# Patient Record
Sex: Male | Born: 1935 | Race: White | Hispanic: No | Marital: Married | Smoking: Never smoker
Health system: Southern US, Community
[De-identification: ages and names within clinical notes are randomized; demographics above are authoritative.]

## PROBLEM LIST (undated history)

## (undated) DIAGNOSIS — I779 Disorder of arteries and arterioles, unspecified: Secondary | ICD-10-CM

## (undated) DIAGNOSIS — N184 Chronic kidney disease, stage 4 (severe): Secondary | ICD-10-CM

## (undated) DIAGNOSIS — I739 Peripheral vascular disease, unspecified: Secondary | ICD-10-CM

## (undated) DIAGNOSIS — Z8719 Personal history of other diseases of the digestive system: Secondary | ICD-10-CM

## (undated) DIAGNOSIS — E669 Obesity, unspecified: Secondary | ICD-10-CM

## (undated) DIAGNOSIS — K219 Gastro-esophageal reflux disease without esophagitis: Secondary | ICD-10-CM

## (undated) DIAGNOSIS — E785 Hyperlipidemia, unspecified: Secondary | ICD-10-CM

## (undated) DIAGNOSIS — I480 Paroxysmal atrial fibrillation: Secondary | ICD-10-CM

## (undated) DIAGNOSIS — I255 Ischemic cardiomyopathy: Secondary | ICD-10-CM

## (undated) DIAGNOSIS — Z8701 Personal history of pneumonia (recurrent): Secondary | ICD-10-CM

## (undated) DIAGNOSIS — E119 Type 2 diabetes mellitus without complications: Secondary | ICD-10-CM

## (undated) DIAGNOSIS — I2510 Atherosclerotic heart disease of native coronary artery without angina pectoris: Secondary | ICD-10-CM

## (undated) DIAGNOSIS — I10 Essential (primary) hypertension: Secondary | ICD-10-CM

## (undated) DIAGNOSIS — M1990 Unspecified osteoarthritis, unspecified site: Secondary | ICD-10-CM

## (undated) DIAGNOSIS — D509 Iron deficiency anemia, unspecified: Secondary | ICD-10-CM

## (undated) HISTORY — DX: Paroxysmal atrial fibrillation: I48.0

## (undated) HISTORY — DX: Personal history of other diseases of the digestive system: Z87.19

## (undated) HISTORY — DX: Type 2 diabetes mellitus without complications: E11.9

## (undated) HISTORY — DX: Peripheral vascular disease, unspecified: I73.9

## (undated) HISTORY — DX: Ischemic cardiomyopathy: I25.5

## (undated) HISTORY — PX: ANTERIOR CERVICAL DISCECTOMY: SHX1160

## (undated) HISTORY — DX: Essential (primary) hypertension: I10

## (undated) HISTORY — DX: Disorder of arteries and arterioles, unspecified: I77.9

## (undated) HISTORY — DX: Atherosclerotic heart disease of native coronary artery without angina pectoris: I25.10

## (undated) HISTORY — DX: Personal history of pneumonia (recurrent): Z87.01

## (undated) HISTORY — DX: Chronic kidney disease, stage 4 (severe): N18.4

## (undated) HISTORY — PX: LAMINECTOMY: SHX219

## (undated) HISTORY — DX: Obesity, unspecified: E66.9

## (undated) HISTORY — DX: Unspecified osteoarthritis, unspecified site: M19.90

## (undated) HISTORY — DX: Iron deficiency anemia, unspecified: D50.9

---

## 1997-04-24 ENCOUNTER — Inpatient Hospital Stay (HOSPITAL_COMMUNITY): Admission: EM | Admit: 1997-04-24 | Discharge: 1997-04-27 | Payer: Self-pay | Admitting: Cardiology

## 1997-04-29 ENCOUNTER — Inpatient Hospital Stay (HOSPITAL_COMMUNITY): Admission: EM | Admit: 1997-04-29 | Discharge: 1997-05-02 | Payer: Self-pay | Admitting: Emergency Medicine

## 1997-12-12 ENCOUNTER — Encounter: Payer: Self-pay | Admitting: Neurological Surgery

## 1997-12-12 ENCOUNTER — Inpatient Hospital Stay (HOSPITAL_COMMUNITY): Admission: RE | Admit: 1997-12-12 | Discharge: 1997-12-16 | Payer: Self-pay | Admitting: Neurological Surgery

## 2000-04-21 ENCOUNTER — Emergency Department (HOSPITAL_COMMUNITY): Admission: EM | Admit: 2000-04-21 | Discharge: 2000-04-22 | Payer: Self-pay | Admitting: Emergency Medicine

## 2000-04-25 ENCOUNTER — Encounter: Payer: Self-pay | Admitting: Neurological Surgery

## 2000-04-26 ENCOUNTER — Encounter: Payer: Self-pay | Admitting: Neurological Surgery

## 2000-04-26 ENCOUNTER — Inpatient Hospital Stay (HOSPITAL_COMMUNITY): Admission: RE | Admit: 2000-04-26 | Discharge: 2000-04-27 | Payer: Self-pay | Admitting: Neurological Surgery

## 2001-10-10 ENCOUNTER — Encounter: Payer: Self-pay | Admitting: Neurological Surgery

## 2001-10-12 ENCOUNTER — Inpatient Hospital Stay (HOSPITAL_COMMUNITY): Admission: RE | Admit: 2001-10-12 | Discharge: 2001-10-13 | Payer: Self-pay | Admitting: Neurological Surgery

## 2001-10-12 ENCOUNTER — Encounter: Payer: Self-pay | Admitting: Neurological Surgery

## 2002-01-11 HISTORY — PX: CORONARY ARTERY BYPASS GRAFT: SHX141

## 2002-05-30 ENCOUNTER — Encounter: Payer: Self-pay | Admitting: Neurological Surgery

## 2002-05-30 ENCOUNTER — Ambulatory Visit (HOSPITAL_COMMUNITY): Admission: RE | Admit: 2002-05-30 | Discharge: 2002-05-30 | Payer: Self-pay | Admitting: Neurological Surgery

## 2002-09-20 ENCOUNTER — Inpatient Hospital Stay (HOSPITAL_COMMUNITY): Admission: EM | Admit: 2002-09-20 | Discharge: 2002-09-30 | Payer: Self-pay | Admitting: Cardiology

## 2002-09-22 ENCOUNTER — Encounter: Payer: Self-pay | Admitting: Cardiothoracic Surgery

## 2002-09-24 ENCOUNTER — Encounter: Payer: Self-pay | Admitting: Thoracic Surgery (Cardiothoracic Vascular Surgery)

## 2002-09-25 ENCOUNTER — Encounter: Payer: Self-pay | Admitting: Thoracic Surgery (Cardiothoracic Vascular Surgery)

## 2002-09-26 ENCOUNTER — Encounter: Payer: Self-pay | Admitting: Thoracic Surgery (Cardiothoracic Vascular Surgery)

## 2002-10-25 ENCOUNTER — Encounter
Admission: RE | Admit: 2002-10-25 | Discharge: 2002-10-25 | Payer: Self-pay | Admitting: Thoracic Surgery (Cardiothoracic Vascular Surgery)

## 2002-10-25 ENCOUNTER — Encounter: Payer: Self-pay | Admitting: Thoracic Surgery (Cardiothoracic Vascular Surgery)

## 2004-01-10 ENCOUNTER — Ambulatory Visit: Payer: Self-pay | Admitting: Cardiology

## 2004-05-06 ENCOUNTER — Inpatient Hospital Stay (HOSPITAL_COMMUNITY): Admission: RE | Admit: 2004-05-06 | Discharge: 2004-05-15 | Payer: Self-pay | Admitting: Neurological Surgery

## 2004-12-11 ENCOUNTER — Ambulatory Visit: Payer: Self-pay | Admitting: Internal Medicine

## 2004-12-12 ENCOUNTER — Inpatient Hospital Stay (HOSPITAL_COMMUNITY): Admission: EM | Admit: 2004-12-12 | Discharge: 2004-12-17 | Payer: Self-pay | Admitting: Emergency Medicine

## 2004-12-15 ENCOUNTER — Ambulatory Visit: Payer: Self-pay | Admitting: Cardiovascular Disease

## 2004-12-15 ENCOUNTER — Encounter: Payer: Self-pay | Admitting: Cardiovascular Disease

## 2004-12-15 ENCOUNTER — Ambulatory Visit: Payer: Self-pay | Admitting: Internal Medicine

## 2004-12-16 ENCOUNTER — Encounter (INDEPENDENT_AMBULATORY_CARE_PROVIDER_SITE_OTHER): Payer: Self-pay | Admitting: *Deleted

## 2004-12-22 ENCOUNTER — Ambulatory Visit: Payer: Self-pay | Admitting: Cardiology

## 2005-01-05 ENCOUNTER — Ambulatory Visit: Payer: Self-pay | Admitting: Internal Medicine

## 2005-04-19 ENCOUNTER — Ambulatory Visit: Payer: Self-pay | Admitting: Cardiology

## 2005-05-10 ENCOUNTER — Ambulatory Visit: Payer: Self-pay | Admitting: Internal Medicine

## 2005-05-26 ENCOUNTER — Ambulatory Visit: Payer: Self-pay | Admitting: Internal Medicine

## 2005-12-22 ENCOUNTER — Encounter: Payer: Self-pay | Admitting: Cardiology

## 2007-05-26 ENCOUNTER — Encounter: Payer: Self-pay | Admitting: Cardiology

## 2007-08-10 ENCOUNTER — Encounter: Payer: Self-pay | Admitting: Cardiology

## 2007-11-30 ENCOUNTER — Encounter: Payer: Self-pay | Admitting: Cardiology

## 2007-12-01 ENCOUNTER — Ambulatory Visit: Payer: Self-pay | Admitting: Cardiology

## 2007-12-05 ENCOUNTER — Ambulatory Visit: Payer: Self-pay | Admitting: Cardiology

## 2007-12-05 DIAGNOSIS — E119 Type 2 diabetes mellitus without complications: Secondary | ICD-10-CM

## 2007-12-06 ENCOUNTER — Encounter: Payer: Self-pay | Admitting: Cardiology

## 2008-01-25 ENCOUNTER — Encounter: Payer: Self-pay | Admitting: Cardiology

## 2008-02-29 ENCOUNTER — Encounter: Payer: Self-pay | Admitting: Cardiology

## 2008-06-14 ENCOUNTER — Encounter: Payer: Self-pay | Admitting: Cardiology

## 2008-11-05 ENCOUNTER — Telehealth (INDEPENDENT_AMBULATORY_CARE_PROVIDER_SITE_OTHER): Payer: Self-pay | Admitting: *Deleted

## 2008-11-14 ENCOUNTER — Encounter: Payer: Self-pay | Admitting: Cardiology

## 2008-12-23 ENCOUNTER — Telehealth (INDEPENDENT_AMBULATORY_CARE_PROVIDER_SITE_OTHER): Payer: Self-pay | Admitting: *Deleted

## 2009-03-28 ENCOUNTER — Telehealth (INDEPENDENT_AMBULATORY_CARE_PROVIDER_SITE_OTHER): Payer: Self-pay | Admitting: *Deleted

## 2009-04-04 ENCOUNTER — Encounter: Payer: Self-pay | Admitting: Cardiology

## 2009-04-07 ENCOUNTER — Telehealth: Payer: Self-pay | Admitting: Cardiology

## 2009-05-12 ENCOUNTER — Ambulatory Visit: Payer: Self-pay | Admitting: Cardiology

## 2009-05-12 ENCOUNTER — Encounter (INDEPENDENT_AMBULATORY_CARE_PROVIDER_SITE_OTHER): Payer: Self-pay | Admitting: *Deleted

## 2009-05-12 DIAGNOSIS — R0602 Shortness of breath: Secondary | ICD-10-CM | POA: Insufficient documentation

## 2009-05-13 ENCOUNTER — Encounter: Payer: Self-pay | Admitting: Cardiology

## 2009-05-15 ENCOUNTER — Encounter: Payer: Self-pay | Admitting: Cardiology

## 2009-05-16 ENCOUNTER — Ambulatory Visit: Payer: Self-pay | Admitting: Cardiology

## 2009-05-19 ENCOUNTER — Encounter: Payer: Self-pay | Admitting: Cardiology

## 2009-05-21 ENCOUNTER — Encounter (INDEPENDENT_AMBULATORY_CARE_PROVIDER_SITE_OTHER): Payer: Self-pay | Admitting: *Deleted

## 2009-05-22 ENCOUNTER — Inpatient Hospital Stay (HOSPITAL_COMMUNITY): Admission: AD | Admit: 2009-05-22 | Discharge: 2009-05-24 | Payer: Self-pay | Admitting: Cardiology

## 2009-05-22 ENCOUNTER — Encounter: Payer: Self-pay | Admitting: Physician Assistant

## 2009-05-22 ENCOUNTER — Ambulatory Visit: Payer: Self-pay | Admitting: Cardiology

## 2009-05-24 ENCOUNTER — Encounter: Payer: Self-pay | Admitting: Cardiology

## 2009-05-26 ENCOUNTER — Encounter: Payer: Self-pay | Admitting: Physician Assistant

## 2009-05-27 ENCOUNTER — Encounter: Payer: Self-pay | Admitting: Cardiology

## 2009-05-29 ENCOUNTER — Telehealth (INDEPENDENT_AMBULATORY_CARE_PROVIDER_SITE_OTHER): Payer: Self-pay | Admitting: *Deleted

## 2009-05-29 ENCOUNTER — Encounter: Payer: Self-pay | Admitting: Cardiology

## 2009-05-31 ENCOUNTER — Encounter: Payer: Self-pay | Admitting: Cardiology

## 2009-06-05 ENCOUNTER — Ambulatory Visit: Payer: Self-pay | Admitting: Cardiology

## 2009-06-10 ENCOUNTER — Encounter: Payer: Self-pay | Admitting: Cardiology

## 2009-06-11 ENCOUNTER — Ambulatory Visit: Payer: Self-pay | Admitting: Cardiology

## 2009-06-12 ENCOUNTER — Encounter: Payer: Self-pay | Admitting: Cardiology

## 2009-06-14 ENCOUNTER — Encounter: Payer: Self-pay | Admitting: Cardiology

## 2009-06-19 ENCOUNTER — Encounter (INDEPENDENT_AMBULATORY_CARE_PROVIDER_SITE_OTHER): Payer: Self-pay | Admitting: *Deleted

## 2009-06-23 ENCOUNTER — Encounter: Payer: Self-pay | Admitting: Cardiology

## 2009-06-26 ENCOUNTER — Telehealth (INDEPENDENT_AMBULATORY_CARE_PROVIDER_SITE_OTHER): Payer: Self-pay | Admitting: *Deleted

## 2009-07-18 ENCOUNTER — Encounter: Payer: Self-pay | Admitting: Physician Assistant

## 2009-07-18 ENCOUNTER — Encounter: Payer: Self-pay | Admitting: Cardiology

## 2009-07-23 ENCOUNTER — Encounter: Payer: Self-pay | Admitting: Cardiology

## 2009-08-08 ENCOUNTER — Encounter: Payer: Self-pay | Admitting: Cardiology

## 2009-08-19 ENCOUNTER — Encounter: Payer: Self-pay | Admitting: Cardiology

## 2009-08-25 ENCOUNTER — Telehealth (INDEPENDENT_AMBULATORY_CARE_PROVIDER_SITE_OTHER): Payer: Self-pay | Admitting: *Deleted

## 2009-08-29 ENCOUNTER — Encounter: Payer: Self-pay | Admitting: Cardiology

## 2009-09-02 ENCOUNTER — Ambulatory Visit: Payer: Self-pay | Admitting: Cardiology

## 2009-09-10 ENCOUNTER — Encounter (INDEPENDENT_AMBULATORY_CARE_PROVIDER_SITE_OTHER): Payer: Self-pay | Admitting: *Deleted

## 2009-09-17 ENCOUNTER — Encounter: Payer: Self-pay | Admitting: Cardiology

## 2009-09-18 ENCOUNTER — Encounter: Payer: Self-pay | Admitting: Cardiology

## 2009-09-25 ENCOUNTER — Telehealth: Payer: Self-pay | Admitting: Cardiology

## 2009-10-13 ENCOUNTER — Telehealth (INDEPENDENT_AMBULATORY_CARE_PROVIDER_SITE_OTHER): Payer: Self-pay | Admitting: *Deleted

## 2009-12-29 ENCOUNTER — Encounter: Payer: Self-pay | Admitting: Cardiology

## 2010-02-10 NOTE — Miscellaneous (Signed)
Summary: Rehab Report/ CARDIAC REHAB DISCHARGE SUMMARY  Rehab Report/ CARDIAC REHAB DISCHARGE SUMMARY   Imported By: Dorise Hiss 09/23/2009 09:56:05  _____________________________________________________________________  External Attachment:    Type:   Image     Comment:   External Document

## 2010-02-10 NOTE — Assessment & Plan Note (Signed)
Summary: 3 MONTH FU -RECV REMINDER VS   Visit Type:  Follow-up Referring Berton Butrick:  Dr. Harland Dingwall Primary Hiep Ollis:  Dr. Margo Common   History of Present Illness: the patient is a 77 year old male history of coronary artery disease status post coronary bypass grafting. The patient underwent a recent heart catheterization and was found to have significant graft disease as well as native coronary artery disease. However it was felt the patient should be treated medically. He actually has done well. He denies any recurrent substernal chest pain. He denies any shortness of breath he does have dyspnea on exertion which is stable and is NYHA class IIb/3. A recent echocardiogram demonstrated an ejection fraction of 40-45% with segmental wall motion abnormalities. There was no significant valvular lesions. The patient also has chronic renal insufficiency and his creatinine has improved and is down to 1.56. He also has significant anemia with hemoglobin 9.8 but after supplementation with iron therapy 3 times a day his hemoglobin is now improved to 11.2. we do not have his black stool samples available the patient is a prior history of GI bleeding. The patient did not have a colonoscopy since 2006. His diabetes under better control and metformin has been able to be discontinued.  Preventive Screening-Counseling & Management  Alcohol-Tobacco     Smoking Status: never  Current Medications (verified): 1)  Glucophage 500 Mg Tabs (Metformin Hcl) .... Take 1 Tablet By Mouth Two Times A Day(On Hold) 2)  Norvasc 5 Mg Tabs (Amlodipine Besylate) .... Take 1 Tablet By Mouth Once A Day 3)  Glucotrol Xl 10 Mg Xr24h-Tab (Glipizide) .... Take 1 Tablet By Mouth Once A Day 4)  Aspirin 81 Mg Tbec (Aspirin) .... Take One Tablet By Mouth Daily 5)  Protonix 40 Mg Tbec (Pantoprazole Sodium) .... Take 1 Tablet By Mouth Once A Day 6)  Plavix 75 Mg Tabs (Clopidogrel Bisulfate) .... Take 1 Tablet By Mouth Once A Day 7)   Nitrostat 0.4 Mg Subl (Nitroglycerin) .... Place 1 Tablet Under Tongue As Directed 8)  Cardura 4 Mg Tabs (Doxazosin Mesylate) .... Take 1 Tablet By Mouth Two Times A Day 9)  Metanx 3-35-2 Mg Tabs (L-Methylfolate-B6-B12) .... Take 1 Tablet By Mouth Once A Day 10)  Pravastatin Sodium 80 Mg Tabs (Pravastatin Sodium) .... Take 1 Tab By Mouth At Bedtime 11)  Hydrochlorothiazide 25 Mg Tabs (Hydrochlorothiazide) .... Take One Tablet By Mouth Daily. 12)  Imdur 30 Mg Xr24h-Tab (Isosorbide Mononitrate) .... Take 1 Tablet By Mouth Once A Day 13)  Coreg 3.125 Mg Tabs (Carvedilol) .... Take 1 Tablet By Mouth Two Times A Day 14)  Ferrous Sulfate 325 (65 Fe) Mg Tbec (Ferrous Sulfate) .... Take 1 Tablet By Mouth Three Times A Day  Allergies: 1)  Penicillin  Comments:  Nurse/Medical Assistant: The patient's medication list and allergies were reviewed with the patient and were updated in the Medication and Allergy Lists.  Past History:  Family History: Last updated: 12/05/2007 notable for father dying of heart disease and mother is still living at age 63 has coronary artery disease. he has also a brother with coronary artery disease.  Social History: Last updated: 12/05/2007 the patient is married denies tobacco or alcohol use.  Risk Factors: Smoking Status: never (09/02/2009)  Past Medical History: 1. Continued patency of the internal mammary to the LAD. 2. 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 3. Severe multiple high-grade lesions with large clot burden in the     saphenous vein graft to the  OM with small distal target, and high-     grade in-stent restenosis. 4. Severe native coronary artery disease.  Status post coronary bypass grafting 2004 with a l to the LAD and the sac was vein graft obtuse marginal and a second vein graft to the diagonal.   Preserved LV function with an ejection fraction of 55% history of upper GI bleeding status post EGD and colonoscopy.    anticoagulation with aspirin and Plavix  iron deficiency anemia secondary to GI bleed improved with current therapy.  diabetes mellitus Chronic renal insufficiency rather than baseline 1.56 hypertension obesity degenerative joint disease history of postoperative atrial fibrillation.  Review of Systems       The patient complains of shortness of breath and muscle weakness.  The patient denies fatigue, malaise, fever, weight gain/loss, vision loss, decreased hearing, hoarseness, chest pain, palpitations, prolonged cough, wheezing, sleep apnea, coughing up blood, abdominal pain, blood in stool, nausea, vomiting, diarrhea, heartburn, incontinence, blood in urine, joint pain, leg swelling, rash, skin lesions, headache, fainting, dizziness, depression, anxiety, enlarged lymph nodes, easy bruising or bleeding, and environmental allergies.    Vital Signs:  Patient profile:   77 year old male Height:      73 inches Weight:      266 pounds O2 Sat:      97 % on Room air Pulse rate:   47 / minute BP sitting:   160 / 75  (left arm) Cuff size:   large  Vitals Entered By: Carlye Grippe (September 02, 2009 2:27 PM)  O2 Flow:  Room air  Serial Vital Signs/Assessments:  Time      Position  BP       Pulse  Resp  Temp     By 2:33 PM             144/73   46                    Lydia Anderson                                PEF    PreRx  PostRx Time      O2 Sat  O2 Type     L/min  L/min  L/min   By 2:33 PM   96  %   Room air                          Carlye Grippe   Physical Exam  Additional Exam:  General: Well-developed, well-nourished in no distress head: Normocephalic and atraumatic eyes PERRLA/EOMI intact, conjunctiva and lids normal nose: No deformity or lesions mouth normal dentition, normal posterior pharynx neck: Supple, no JVD.  No masses, thyromegaly or abnormal cervical nodes lungs: Normal breath sounds bilaterally without wheezing.  Normal percussion heart: regular rate and rhythm  with normal S1 and S2, no S3 or S4.  PMI is normal.  No pathological murmurs abdomen: Normal bowel sounds, abdomen is soft and nontender without masses, organomegaly or hernias noted.  No hepatosplenomegaly musculoskeletal: Back normal, normal gait muscle strength and tone normal pulsus: Pulse is normal in all 4 extremities Extremities: No peripheral pitting edema neurologic: Alert and oriented x 3 skin: Intact without lesions or rashes cervical nodes: No significant adenopathy psychologic: Normal affect    Impression & Recommendations:  Problem # 1:  RENAL FAILURE, CHRONIC (ICD-585.9) creatinine improved significantly.  Problem # 2:  ANEMIA (ICD-285.9) patient has iron deficiency anemia. Hemoglobin has increased significantly with iron supplementation. We'll obtain guaiac stool samples and deferred patient whose primary care physician for further evaluation. His last colonoscopy for GI bleeding was in 2006.  Problem # 3:  LEFT VENTRICULAR FUNCTION, DECREASED (ICD-429.2) ejection fraction is 40-45%. The patient is NYHA class IIb. He has no resting symptoms. He does not appear to be volume overloaded. Will not change his medical regimen for now  Problem # 4:  CORONARY ARTERY BYPASS GRAFT, HX OF (ICD-V45.81) patient had a recent cardiac catheter. He has significant disease. The plan is to continue medical therapy for now. The patient did not have good targets for either PCI or redo surgery.  Patient Instructions: 1)  Iron 325mg  three times a day 2)  Follow up in  6 months Prescriptions: FERROUS SULFATE 325 (65 FE) MG TBEC (FERROUS SULFATE) Take 1 tablet by mouth three times a day  #90 x 3   Entered by:   Hoover Brunette, LPN   Authorized by:   Lewayne Bunting, MD, [facility]   Signed by:   Hoover Brunette, LPN on 45/40/9811   Method used:   Electronically to        [facility] (retail)       [address]       [place], [place]  [address]       Ph: [phone]       Fax: [phone]    RxID:   [id number]

## 2010-02-10 NOTE — Assessment & Plan Note (Signed)
Summary: f21m   Visit Type:  Follow-up Referring Provider:  Dr. Harland Dingwall Primary Provider:  Dr. Margo Common   History of Present Illness: the patient is a 77 year old male with coronary artery disease.e, status post coronary bypass grafting.coronary bypass grafting was in 2004. The patient had it would be catheterization 2006 and was found to have a 90% stenosis at the distal insertion site of the vein graft to the circumflex coronary artery. The patient underwent drug-eluting stent placement. He also has stress testing 2009 with an area of inferior ischemia and an ejection fraction of 43% but the patient was treated medically. He has done well with no recurrent chest pain. Unfortunately over the last several months he has noticed some decrease in energy and increasing weight. He also has decreased stamina. In the past he has been unable to tolerate Lipitor due to myalgias.  The patient denies intercurrent chest pain as reported above has increased short of breath on exertion. He denies any palpitations or syncope.  Preventive Screening-Counseling & Management  Alcohol-Tobacco     Smoking Status: never  Current Problems (verified): 1)  Carotid Bruit, Right  (ICD-785.9) 2)  Shortness of Breath  (ICD-786.05) 3)  Oth Spec D/o Result From Impaired Renal Function  (ICD-588.89) 4)  Gastrointestinal Hemorrhage, Hx of  (ICD-V12.79) 5)  Aodm  (ICD-250.00) 6)  Essential Hypertension, Benign  (ICD-401.1) 7)  Oth Nonspecific Abnorm Cv System Function Study  (ICD-794.39) 8)  Coronary Artery Disease, S/p Ptca  (ICD-414.9) 9)  Myocardial Infarction, Hx of  (ICD-412) 10)  Coronary Artery Bypass Graft, Hx of  (ICD-V45.81) 11)  Coronary Atherosclerosis Native Coronary Artery  (ICD-414.01)  Current Medications (verified): 1)  Glucophage 500 Mg Tabs (Metformin Hcl) .... Take 1 Tablet By Mouth Two-Three Times A Day 2)  Norvasc 5 Mg Tabs (Amlodipine Besylate) .... Take 1 Tablet By Mouth Once A Day 3)   Glucotrol Xl 10 Mg Xr24h-Tab (Glipizide) .... Take 1 Tablet By Mouth Two Times A Day 4)  Aspirin 81 Mg Tbec (Aspirin) .... Take One Tablet By Mouth Daily 5)  Protonix 40 Mg Tbec (Pantoprazole Sodium) .... Take 1 Tablet By Mouth Once A Day 6)  Plavix 75 Mg Tabs (Clopidogrel Bisulfate) .... Take 1 Tablet By Mouth Once A Day 7)  Benazepril Hcl 10 Mg Tabs (Benazepril Hcl) .... Take 1 Tablet By Mouth Once A Day 8)  Nitrostat 0.4 Mg Subl (Nitroglycerin) .... Place 1 Tablet Under Tongue As Directed 9)  Cardura 4 Mg Tabs (Doxazosin Mesylate) .... Take 1 Tablet By Mouth Two Times A Day 10)  Metanx 3-35-2 Mg Tabs (L-Methylfolate-B6-B12) .... Take 1 Tablet By Mouth Once A Day 11)  Pravastatin Sodium 20 Mg Tabs (Pravastatin Sodium) .... Take 1 Tab By Mouth At Bedtime  Allergies: 1)  Penicillin 2)  Penicillin V Potassium (Penicillin V Potassium)  Comments:  Nurse/Medical Assistant: The patient's medications were reviewed with the patient and were updated in the Medication List. Pt brought a list of medications to office visit.  Cyril Loosen, RN, BSN (May 12, 2009 3:27 PM)  Past History:  Past Medical History: Last updated: 12/05/2007 modest elevation microinfarction per Dr. stent of the distal circumflex 2006 residual coronary artery disease 90% proximal right coronary artery with distal occlusion medical therapy recommended. Status post coronary bypass grafting 2004 with a l to the LAD and the sac was vein graft obtuse marginal and a second vein graft to the diagonal.  All grafts patent on catheterization. Preserved LV function with an  ejection fraction of 55% history of upper GI bleeding status post EGD and colonoscopy.   anticoagulation with aspirin and Plavix  iron deficiency anemia secondary to GI bleed  diabetes mellitus hypertension obesity degenerative joint disease history of postoperative atrial fibrillation.  Family History: Last updated: 12/05/2007 notable for father dying of  heart disease and mother is still living at age 28 has coronary artery disease. he has also a brother with coronary artery disease.  Social History: Last updated: 12/05/2007 the patient is married denies tobacco or alcohol use.  Social History: Smoking Status:  never  Review of Systems       The patient complains of fatigue.  The patient denies malaise, fever, weight gain/loss, vision loss, decreased hearing, hoarseness, chest pain, palpitations, shortness of breath, prolonged cough, wheezing, sleep apnea, coughing up blood, abdominal pain, blood in stool, nausea, vomiting, diarrhea, heartburn, incontinence, blood in urine, muscle weakness, joint pain, leg swelling, rash, skin lesions, headache, fainting, dizziness, depression, anxiety, enlarged lymph nodes, easy bruising or bleeding, and environmental allergies.    Vital Signs:  Patient profile:   77 year old male Height:      73 inches Weight:      276.50 pounds BMI:     36.61 Pulse rate:   57 / minute BP sitting:   164 / 72  (left arm) Cuff size:   large  Vitals Entered By: Cyril Loosen, RN, BSN (May 12, 2009 3:21 PM)  Nutrition Counseling: Patient's BMI is greater than 25 and therefore counseled on weight management options. Comments Follow up visit   Physical Exam  Additional Exam:  General: Well-developed, well-nourished in no distress head: Normocephalic and atraumatic eyes PERRLA/EOMI intact, conjunctiva and lids normal nose: No deformity or lesions mouth normal dentition, normal posterior pharynx neck: Supple, no JVD.  No masses, thyromegaly or abnormal cervical nodes lungs: Normal breath sounds bilaterally without wheezing.  Normal percussion heart: regular rate and rhythm with normal S1 and S2, no S3 or S4.  PMI is normal.  No pathological murmurs abdomen: Normal bowel sounds, abdomen is soft and nontender without masses, organomegaly or hernias noted.  No hepatosplenomegaly musculoskeletal: Back normal, normal  gait muscle strength and tone normal pulsus: Pulse is normal in all 4 extremities Extremities: No peripheral pitting edema neurologic: Alert and oriented x 3 skin: Intact without lesions or rashes cervical nodes: No significant adenopathy psychologic: Normal affect    Impression & Recommendations:  Problem # 1:  CAROTID BRUIT, RIGHT (ICD-785.9) the patient has a newly diagnosed carotid bruit on exam today. We will order carotid Dopplers. Orders: Carotid Duplex (Carotid Duplex)  Problem # 2:  SHORTNESS OF BREATH (ICD-786.05) the patient's shortness of breath is likely multifactorial and may well be due to deconditioning. He has also gained weight. However the patient could have silent ischemia, he has known inferior ischemia and his grafts are now 75 years old. We will proceed with a Lexiscan. The following medications were removed from the medication list:    Hydrochlorothiazide 25 Mg Tabs (Hydrochlorothiazide) ..... One tablet p.o. daily His updated medication list for this problem includes:    Norvasc 5 Mg Tabs (Amlodipine besylate) .Marland Kitchen... Take 1 tablet by mouth once a day    Aspirin 81 Mg Tbec (Aspirin) .Marland Kitchen... Take one tablet by mouth daily    Benazepril Hcl 10 Mg Tabs (Benazepril hcl) .Marland Kitchen... Take 1 tablet by mouth once a day  Orders: Nuclear Med (Nuc Med)  Problem # 3:  AODM (ICD-250.00) Assessment: Comment  Only  His updated medication list for this problem includes:    Glucophage 500 Mg Tabs (Metformin hcl) .Marland Kitchen... Take 1 tablet by mouth two-three times a day    Glucotrol Xl 10 Mg Xr24h-tab (Glipizide) .Marland Kitchen... Take 1 tablet by mouth two times a day    Aspirin 81 Mg Tbec (Aspirin) .Marland Kitchen... Take one tablet by mouth daily    Benazepril Hcl 10 Mg Tabs (Benazepril hcl) .Marland Kitchen... Take 1 tablet by mouth once a day  Problem # 4:  ESSENTIAL HYPERTENSION, BENIGN (ICD-401.1) blood pressure were controlled and I made no changes in his medications. The following medications were removed from the  medication list:    Hydrochlorothiazide 25 Mg Tabs (Hydrochlorothiazide) ..... One tablet p.o. daily His updated medication list for this problem includes:    Norvasc 5 Mg Tabs (Amlodipine besylate) .Marland Kitchen... Take 1 tablet by mouth once a day    Aspirin 81 Mg Tbec (Aspirin) .Marland Kitchen... Take one tablet by mouth daily    Benazepril Hcl 10 Mg Tabs (Benazepril hcl) .Marland Kitchen... Take 1 tablet by mouth once a day    Cardura 4 Mg Tabs (Doxazosin mesylate) .Marland Kitchen... Take 1 tablet by mouth two times a day  Problem # 5:  CORONARY ARTERY BYPASS GRAFT, HX OF (ICD-V45.81) Assessment: Comment Only the patient has been unable to tolerate Lipitor in the past but we will try low dose Pravachol 20 mg p.o. q.h.s. Orders: EKG w/ Interpretation (93000)  Patient Instructions: 1)  Lexiscan Cardiolite 2)  Carotid Dopplers 3)  Pravastatin 20mg  at bedtime  4)  Follow up in  6 months  Prescriptions: PRAVASTATIN SODIUM 20 MG TABS (PRAVASTATIN SODIUM) Take 1 tab by mouth at bedtime  #30 x 6   Entered by:   Hoover Brunette, LPN   Authorized by:   Lewayne Bunting, MD, [facility]   Signed by:   Hoover Brunette, LPN on 04/54/0981   Method used:   Electronically to        [facility] (retail)       [address]       [place], [place]  [address]       Ph: [phone]       Fax: [phone]   RxID:   [id number]   Handout requested.

## 2010-02-10 NOTE — Assessment & Plan Note (Signed)
Summary: eph-post cone 7-10 f/u   Visit Type:  hospital follow-up Referring Provider:  Dr. Harland Dingwall Primary Provider:  Dr. Margo Common   History of Present Illness: the patient is a 77 year old male with history of coronary bypass grafting in 2004 and stent placement to the circumflex coronary artery in 2009. He underwent a repeat cardiac catheterization and was found to have a severely diseased LAD with 80% narrowing in competitive flow distally. The stent of the circumflex was less than 40% stenosed. The right coronary artery is an 80% proximal stenosis and severely diseased it is totally occluded distally. The saphenous vein graft to the diagonal is occluded which is new from previous study the saphenous vein graft to the obtuse marginal branch is severely diseased 80% stenosis followed by 99% stenosis with clots and another 99% stenosis with clot. After his this insertion the previously placed stent with restenosis 90%. based on these findings it was felt the patient would not benefit from redo bypass surgery or coronary intervention.  The patient denies any chest pain. He was able to mow his lawn with a lawnmower this weekend. He had a recent pneumonia with fever and chills which he is recovering from.  He has renal insufficiency the creatinine of 1.81 and anemia with a hemoglobin of 9.8. Iron saturation is low at 10%. The patient is a prior history of bleeding ulcers.   Current Problems (verified): 1)  Dyslipidemia  (ICD-272.4) 2)  Renal Failure, Chronic  (ICD-585.9) 3)  Anemia  (ICD-285.9) 4)  Left Ventricular Function, Decreased  (ICD-429.2) 5)  Carotid Bruit, Right  (ICD-785.9) 6)  Shortness of Breath  (ICD-786.05) 7)  Oth Spec D/o Result From Impaired Renal Function  (ICD-588.89) 8)  Gastrointestinal Hemorrhage, Hx of  (ICD-V12.79) 9)  Aodm  (ICD-250.00) 10)  Essential Hypertension, Benign  (ICD-401.1) 11)  Oth Nonspecific Abnorm Cv System Function Study  (ICD-794.39) 12)   Coronary Artery Disease, S/p Ptca  (ICD-414.9) 13)  Myocardial Infarction, Hx of  (ICD-412) 14)  Coronary Artery Bypass Graft, Hx of  (ICD-V45.81) 15)  Coronary Atherosclerosis Native Coronary Artery  (ICD-414.01)  Current Medications (verified): 1)  Glucophage 500 Mg Tabs (Metformin Hcl) .... Take 1 Tablet By Mouth Two-Three Times A Day 2)  Norvasc 5 Mg Tabs (Amlodipine Besylate) .... Take 1 Tablet By Mouth Once A Day 3)  Glucotrol Xl 10 Mg Xr24h-Tab (Glipizide) .... Take 1 Tablet By Mouth Two Times A Day 4)  Aspirin 81 Mg Tbec (Aspirin) .... Take One Tablet By Mouth Daily 5)  Protonix 40 Mg Tbec (Pantoprazole Sodium) .... Take 1 Tablet By Mouth Once A Day 6)  Plavix 75 Mg Tabs (Clopidogrel Bisulfate) .... Take 1 Tablet By Mouth Once A Day 7)  Benazepril Hcl 10 Mg Tabs (Benazepril Hcl) .... Take 1 Tablet By Mouth Once A Day 8)  Nitrostat 0.4 Mg Subl (Nitroglycerin) .... Place 1 Tablet Under Tongue As Directed 9)  Cardura 4 Mg Tabs (Doxazosin Mesylate) .... Take 1 Tablet By Mouth Two Times A Day 10)  Metanx 3-35-2 Mg Tabs (L-Methylfolate-B6-B12) .... Take 1 Tablet By Mouth Once A Day 11)  Pravastatin Sodium 80 Mg Tabs (Pravastatin Sodium) .... Take 1 Tab By Mouth At Bedtime 12)  Hydrochlorothiazide 25 Mg Tabs (Hydrochlorothiazide) .... Take One Tablet By Mouth Daily. 13)  Imdur 30 Mg Xr24h-Tab (Isosorbide Mononitrate) .... Take 1 Tablet By Mouth Once A Day 14)  Coreg 3.125 Mg Tabs (Carvedilol) .... Take 1 Tablet By Mouth Two Times A Day  Allergies (  verified): 1)  Penicillin 2)  Penicillin V Potassium (Penicillin V Potassium)  Comments:  Nurse/Medical Assistant: The patient is currently on medications but does not know the name or dosage at this time. Instructed to contact our office with details. Will update medication list at that time.  Past History:  Past Medical History: Last updated: 12/05/2007 modest elevation microinfarction per Dr. stent of the distal circumflex  2006 residual coronary artery disease 90% proximal right coronary artery with distal occlusion medical therapy recommended. Status post coronary bypass grafting 2004 with a l to the LAD and the sac was vein graft obtuse marginal and a second vein graft to the diagonal.  All grafts patent on catheterization. Preserved LV function with an ejection fraction of 55% history of upper GI bleeding status post EGD and colonoscopy.   anticoagulation with aspirin and Plavix  iron deficiency anemia secondary to GI bleed  diabetes mellitus hypertension obesity degenerative joint disease history of postoperative atrial fibrillation.  Family History: Last updated: 12/05/2007 notable for father dying of heart disease and mother is still living at age 101 has coronary artery disease. he has also a brother with coronary artery disease.  Social History: Last updated: 12/05/2007 the patient is married denies tobacco or alcohol use.  Risk Factors: Smoking Status: never (05/12/2009)  Review of Systems       The patient complains of fatigue and shortness of breath.  The patient denies malaise, fever, weight gain/loss, vision loss, decreased hearing, hoarseness, chest pain, palpitations, prolonged cough, wheezing, sleep apnea, coughing up blood, abdominal pain, blood in stool, nausea, vomiting, diarrhea, heartburn, incontinence, blood in urine, muscle weakness, joint pain, leg swelling, rash, skin lesions, headache, fainting, dizziness, depression, anxiety, enlarged lymph nodes, easy bruising or bleeding, and environmental allergies.    Vital Signs:  Patient profile:   77 year old male Height:      73 inches Weight:      264 pounds Pulse rate:   58 / minute BP sitting:   167 / 69  (left arm) Cuff size:   large  Vitals Entered By: Carlye Grippe (Jun 05, 2009 1:27 PM)  Physical Exam  Additional Exam:  General: Well-developed, well-nourished in no distress head: Normocephalic and atraumatic eyes  PERRLA/EOMI intact, conjunctiva and lids normal nose: No deformity or lesions mouth normal dentition, normal posterior pharynx neck: Supple, no JVD.  No masses, thyromegaly or abnormal cervical nodes lungs: Normal breath sounds bilaterally without wheezing.  Normal percussion heart: regular rate and rhythm with normal S1 and S2, no S3 or S4.  PMI is normal.  No pathological murmurs abdomen: Normal bowel sounds, abdomen is soft and nontender without masses, organomegaly or hernias noted.  No hepatosplenomegaly musculoskeletal: Back normal, normal gait muscle strength and tone normal pulsus: Pulse is normal in all 4 extremities Extremities: No peripheral pitting edema neurologic: Alert and oriented x 3 skin: Intact without lesions or rashes cervical nodes: No significant adenopathy psychologic: Normal affect    Impression & Recommendations:  Problem # 1:  CORONARY ARTERY BYPASS GRAFT, HX OF (ICD-V45.81) Plavix will be continued. The patient fortunately does not have any central chest pain and does not meet up titration of his antianginal medications Orders: T-CBC No Diff ([id number]) T-Iron ([id number]) T-Reticulocyte Count, Manual (30865) T-Vitamin B12 (78469-62952) T- * Misc. Laboratory test [phone]) 2-D Echocardiogram (2D Echo)  Problem # 2:  DYSLIPIDEMIA (ICD-272.4) given the patient's significant disease will increase Pravachol to 80 mg p.o. q.h.s. His updated medication list for this problem includes:  Pravastatin Sodium 80 Mg Tabs (Pravastatin sodium) .Marland Kitchen... Take 1 tab by mouth at bedtime  Problem # 3:  ANEMIA (ICD-285.9) the patient's prior history of GI hemorrhage. Iron saturation is low. We will obtain a full set of iron studies and further GI evaluation if no evidence of anemia of chronic disease.  Problem # 4:  RENAL FAILURE, CHRONIC (ICD-585.9) metformin is likely contraindicated and this can be discontinued. Blood sugars reportedly had been under improved  control.  Problem # 5:  LEFT VENTRICULAR FUNCTION, DECREASED (ICD-429.2) patient is a follow up echocardiogram. If ejection fraction less than 35% a referral will be made to the electrophysiology service.in addition we will add Corag 3.125 months p.o. b.i.d.with close monitoring of the patient's heart rate as he has a baseline bradycardia  Patient Instructions: 1)  Increase Pravachol to 80mg  at bedtime  2)  Coreg 3.125mg  two times a day  3)  Echo  4)  Labs  5)  Follow up in  3 months  Prescriptions: COREG 3.125 MG TABS (CARVEDILOL) Take 1 tablet by mouth two times a day  #60 x 6   Entered by:   Hoover Brunette, LPN   Authorized by:   Lewayne Bunting, MD, [facility]   Signed by:   Hoover Brunette, LPN on 78/29/5621   Method used:   Electronically to        [facility] (retail)       [address]       [place], [place]  [address]       Ph: [phone]       Fax: [phone]   RxID:   [id number]   Handout requested. PRAVASTATIN SODIUM 80 MG TABS (PRAVASTATIN SODIUM) Take 1 tab by mouth at bedtime  #30 x 6   Entered by:   Hoover Brunette, LPN   Authorized by:   Lewayne Bunting, MD, [facility]   Signed by:   Hoover Brunette, LPN on 95/63/8756   Method used:   Electronically to        [facility] (retail)       [address]       [place], [place]  [address]       Ph: [phone]       Fax: [phone]   RxID:   [id number]

## 2010-02-10 NOTE — Progress Notes (Signed)
Summary: PHONE: HEART RATE  Phone Note Call from Patient Call back at Home Phone [phone]   Caller: [facility] ANN Reason for Call: Talk to Doctor Details for Reason: Speak with Dr. Andee Lineman Summary of Call: Heart Rate is beginning to drop. in the 40's this am and is dizzy Please call Benjamin [name] [address]10 Benjamin [name] is in rehab today Initial call taken by: Zachary George,  June 26, 2009 9:09 AM  Follow-up for Phone Call        Per pt's wife, pt was dizzy this am with HR of 40. She states after rehab today (during recovery) HR up to 48. Pt's wife aware that Dr. Earnestine Leys is out of the office this afternoon. She is aware note will be sent to him for review and she will be notified of his recommendations. Follow-up by: Cyril Loosen, RN, BSN,  June 26, 2009 2:38 PM  Additional Follow-up for Phone Call Additional follow up Details #1::        discontinue Coreg. Recheck heart rate. If continued problems with heart rates in the 40s apply 48 hour Holter monitor. Additional Follow-up by: Lewayne Bunting, MD, [facility],  June 27, 2009 8:38 AM    Additional Follow-up for Phone Call Additional follow up Details #2::    Attempted to reach pt's wife but work number busy. Cyril Loosen, RN, BSN  June 27, 2009 9:42 AM  Pt's wife notified and verbalized understanding. Follow-up by: Cyril Loosen, RN, BSN,  June 27, 2009 2:15 PM

## 2010-02-10 NOTE — Letter (Signed)
Summary: Engineer, materials at [facility]  [address]   [place], [place] [address]   Phone: [phone]  Fax: [phone]        June 19, 2009 MRN: [id number]   Benjamin [name] [address] [place], [place]  [address]   Dear [name],  Your test ordered by Selena Batten has been reviewed by your physician (or physician assistant) and was found to be normal or stable. Your physician (or physician assistant) felt no changes were needed at this time.  ____ Echocardiogram  ____ Cardiac Stress Test  ____ Lab Work  ____ Peripheral vascular study of arms, legs or neck  ____ CT scan or X-ray  ____ Lung or Breathing test  __X__ Other:  fecal occult blood screen negative for blood   Thank you.   Hoover Brunette, LPN    Duane Boston, M.D., F.A.C.C. Thressa Sheller, M.D., F.A.C.C. Oneal Grout, M.D., F.A.C.C. Cheree Ditto, M.D., F.A.C.C. Daiva Nakayama, M.D., F.A.C.C. Kenney Houseman, M.D., F.A.C.C. Jeanne Ivan, PA-C

## 2010-02-10 NOTE — Progress Notes (Signed)
Summary: PHONE:  MEDICATION ISSUES  Phone Note Call from Patient Call back at Home Phone [phone]   Caller: Benjamin [name] ANN  EXT 2810 Details for Reason: MEDICATIONS Summary of Call: [name] called today in regards to patients Plavix 75 mg and his Protonix 40mg . she has been told that these 2 medications can interact with each other when taken together.   Initial call taken by: Zachary George,  April 07, 2009 4:00 PM  Follow-up for Phone Call        currently it is not clear that the 2 medications interact. If the patient is concerned about it she can switch pProtonix to Zantac 300 milligrams p.o. q.h.s. Follow-up by: Lewayne Bunting, MD, [facility],  April 08, 2009 5:52 PM  Additional Follow-up for Phone Call Additional follow up Details #1::        Patient notified.    States that she had also discussed with Dr. Karilyn Cota because she does his dictation and that he seemed to think it was okay also as long as the two were not taken at the same time.  So, they switched to taking Plavix at night and his Protonix in the morning.   Additional Follow-up by: Hoover Brunette, LPN,  April 11, 2009 11:29 AM

## 2010-02-10 NOTE — Progress Notes (Signed)
Summary: PHONE: REFILL ON MEDICATION  Phone Note Call from Patient Call back at Home Phone [phone]   Caller: [facility] ANN  WIFE Reason for Call: Refill Medication Summary of Call: Needs refill on Doxazosin 4 mg 60 tabs [facility] [place] [place] Initial call taken by: Zachary George,  August 25, 2009 8:31 AM  Follow-up for Phone Call        Done. Follow-up by: Hoover Brunette, LPN,  August 25, 2009 11:12 AM    Prescriptions: CARDURA 4 MG TABS (DOXAZOSIN MESYLATE) Take 1 tablet by mouth two times a day  #60 x 6   Entered by:   Hoover Brunette, LPN   Authorized by:   Lewayne Bunting, MD, [facility]   Signed by:   Hoover Brunette, LPN on 10/93/2355   Method used:   Electronically to        [facility] (retail)       [address]       [place], [place]  [address]       Ph: [phone]       Fax: [phone]   RxID:   [id number]

## 2010-02-10 NOTE — Miscellaneous (Signed)
Summary: Nuclear Stress  Clinical Lists Changes  Observations: Added new observation of NUCLEAR NOS: Nuclear Cardiology Conclusion : Abnormal LV perfusion.           Abnormal with Tc-20m sestamibi imaging. Stress testing induced no chest pain symptoms and a non-diagnostic ECG response. Global left ventricular systolic function was severely reduced, with an EF of 31%. In addition, severe global hypokinesis was present.  There was a medium, partially reversible, mid to basal- anterior defect. This defect was consistent with ischemia in addition to prior myocardial infarction. There was also a second large, partially reversible, mid to basal inferior/inferolateral defect. This defect was consistent with ischemia in addition to prior myocardial infarction. In addition, there was a third medium, completely reversible, mid to basal-lateral defect. This defect was consistent with ischemia.       (05/16/2009 12:49)      Nuclear Study  Procedure date:  05/16/2009  Findings:      Nuclear Cardiology Conclusion : Abnormal LV perfusion.           Abnormal with Tc-38m sestamibi imaging. Stress testing induced no chest pain symptoms and a non-diagnostic ECG response. Global left ventricular systolic function was severely reduced, with an EF of 31%. In addition, severe global hypokinesis was present.  There was a medium, partially reversible, mid to basal- anterior defect. This defect was consistent with ischemia in addition to prior myocardial infarction. There was also a second large, partially reversible, mid to basal inferior/inferolateral defect. This defect was consistent with ischemia in addition to prior myocardial infarction. In addition, there was a third medium, completely reversible, mid to basal-lateral defect. This defect was consistent with ischemia.        Appended Document: Nuclear Stress Abnormal cardiolite. Talked already to wife. Schedule for OP cath next week with Brodie.  Needs hydration and NaHCO3 for renal insuff. No V-gram.

## 2010-02-10 NOTE — Letter (Signed)
Summary: External Correspondence/ PRE-CATH ORDERS  External Correspondence/ PRE-CATH ORDERS   Imported By: Dorise Hiss 06/12/2009 13:53:50  _____________________________________________________________________  External Attachment:    Type:   Image     Comment:   External Document

## 2010-02-10 NOTE — Miscellaneous (Signed)
Summary: Orders Update - cardiac rehab  Clinical Lists Changes  Orders: Added new Referral order of Cardiac Rehabilitation (Cardiac Rehab) - Signed 

## 2010-02-10 NOTE — Miscellaneous (Signed)
Summary: Orders Update - precath labs   Clinical Lists Changes  Orders: Added new Test order of T-Chest x-ray, 2 views ([id number]) - Signed Added new Test order of T-Basic Metabolic Panel [phone]) - Signed Added new Test order of T-CBC No Diff ([id number]) - Signed Added new Test order of T-PTT ([id number]) - Signed Added new Test order of T-Protime, Auto ([id number]) - Signed

## 2010-02-10 NOTE — Letter (Signed)
Summary: Lexiscan or Dobutamine Pharmacist, community at [facility]  [address]   [place], [place] [address]   Phone: [phone]  Fax: [phone]      [facility]  Lexiscan or Dobutamine Cardiolite Strss Test    [place]  Appointment Date:_  Appointment Time:_  Your doctor has ordered a CARDIOLITE STRESS TEST using a medication to stimulate exercise so that you will not have to walk on the treadmill to determine the condition of your heart during stress. If you take blood pressure medication, ask your doctor if you should take it the day of your test. You should not have anything to eat or drink at least 4 hours before your test is scheduled, and no caffeine, including decaffeinated tea and coffee, chocolate, and soft drinks for 24 hours before your test.  You will need to register at the Outpatient/Main Entrance at the hospital 15 minutes before your appointment time. It is a good idea to bring a copy of your order with you. They will direct you to the Diagnostic Imaging (Radiology) Department.  You will be asked to undress from the waist up and given a hospital gown to wear, so dress comfortably from the waist down for example: Sweat pants, shorts, or skirt Rubber soled lace up shoes (tennis shoes)  Plan on about three hours from registration to release from the hospital

## 2010-02-10 NOTE — Letter (Signed)
Summary: Engineer, materials at [facility]  [address]   [place], [place] [address]   Phone: [phone]  Fax: [phone]        May 21, 2009 MRN: [id number]   Benjamin [name] [address] [place], [place]  [address]   Dear Mr. Belote,  Your test ordered by Selena Batten has been reviewed by your physician (or physician assistant) and was found to be normal or stable. Your physician (or physician assistant) felt no changes were needed at this time.  ____ Echocardiogram  ____ Cardiac Stress Test  ____ Lab Work  __X__ Peripheral vascular study of arms, legs or neck   ____ CT scan or X-ray  ____ Lung or Breathing test  __X__ Other:  carotid duplex - no significant disease per Dr. Andee Lineman    Thank you.   Hoover Brunette, LPN    Duane Boston, M.D., F.A.C.C. Thressa Sheller, M.D., F.A.C.C. Oneal Grout, M.D., F.A.C.C. Cheree Ditto, M.D., F.A.C.C. Daiva Nakayama, M.D., F.A.C.C. Kenney Houseman, M.D., F.A.C.C. Jeanne Ivan, PA-C

## 2010-02-10 NOTE — Progress Notes (Signed)
Summary: PHONE: SICKNESS      Phone Note Call from Patient Call back at Home Phone [phone]   Caller:  [facility] Summary of Call: Benjamin [name] Called stating that Benjamin [name] has been running a low grade fever, chills, aching all over since he came home from the hospital. Has an appt. today 05-29-2009 with Dr. Margo Common. Her concern is does he need to see Dr. Andee Lineman for this.  Initial call taken by: Zachary George,  May 29, 2009 9:21 AM  Follow-up for Phone Call        Left message to return call.  Hoover Brunette, LPN  May 29, 2009 9:26 AM   Spoke with Benjamin [name] and advised him to go ahead and let Dr. Margo Common evaluate him since he already has appt. with him today.  He will see GD on 5/26.    Follow-up by: Hoover Brunette, LPN,  May 29, 2009 10:59 AM

## 2010-02-10 NOTE — Miscellaneous (Signed)
Summary: Orders Update - cbc  Clinical Lists Changes  Orders: Added new Test order of T-CBC No Diff ([id number]) - Signed 

## 2010-02-10 NOTE — Miscellaneous (Signed)
Summary: Orders Update - bmet  Clinical Lists Changes  Orders: Added new Test order of T-Basic Metabolic Panel ([id number]) - Signed 

## 2010-02-10 NOTE — Miscellaneous (Signed)
Summary: rx - iron tab  Clinical Lists Changes  Medications: Added new medication of FERROUS SULFATE 325 (65 FE) MG TBEC (FERROUS SULFATE) Take 1 tablet by mouth three times a day - Signed Rx of FERROUS SULFATE 325 (65 FE) MG TBEC (FERROUS SULFATE) Take 1 tablet by mouth three times a day;  #90 x 1;  Signed;  Entered by: Hoover Brunette, LPN;  Authorized by: Lewayne Bunting, MD, [facility];  Method used: Electronically to [facility] [address] [place], [place]  [address], Ph: [phone], Fax: [phone]    Prescriptions: FERROUS SULFATE 325 (65 FE) MG TBEC (FERROUS SULFATE) Take 1 tablet by mouth three times a day  #90 x 1   Entered by:   Hoover Brunette, LPN   Authorized by:   Lewayne Bunting, MD, [facility]   Signed by:   Hoover Brunette, LPN on 13/08/6576   Method used:   Electronically to        [facility] (retail)       [address]       [place], [place]  [address]       Ph: [phone]       Fax: [phone]   RxID:   [id number]

## 2010-02-10 NOTE — Miscellaneous (Signed)
Summary: Rehab Report/ CARDIAC REHAB PROGRESS REPORT  Rehab Report/ CARDIAC REHAB PROGRESS REPORT   Imported By: Dorise Hiss 08/20/2009 16:47:52  _____________________________________________________________________  External Attachment:    Type:   Image     Comment:   External Document

## 2010-02-10 NOTE — Miscellaneous (Signed)
Summary: Orders Update - JV cath  Clinical Lists Changes  Orders: Added new Referral order of Cardiac Catheterization (Cardiac Cath) - Signed

## 2010-02-10 NOTE — Progress Notes (Signed)
Summary: PHONE; NEEDS REILL  Phone Note Call from Patient   Caller: [facility] WIFE Summary of Call: NEEDS REFILL ON FERROUS SULFATE 325  [facility] [place], [place]  [address] Initial call taken by: Zachary George,  October 13, 2009 11:05 AM  Follow-up for Phone Call        Spoke with wife.  Notified done on 8/23 with 90 day + 3 refills.  Follow-up by: Hoover Brunette, LPN,  October 13, 2009 4:57 PM

## 2010-02-10 NOTE — Progress Notes (Signed)
Summary: NEED REFILLS ON MEDS  Phone Note Call from Patient Call back at 2810   Caller: [facility] Call For: nurse Summary of Call: message left on machine by wife to say that [facility] has sent two request over for meds and we haven't responded. Nurse called wife back and informed her that no request came to our office, and she should find out exactly what was needed so we can take care of this. Nurse also informed wife that patient needed ov since no visit since 2009. Wife said that one of the meds needed was HCTZ 25mg . Awaiting call back to get second med. Wife called back and nurse informed her that we sent request to [facility] already for hctz and imdur. and transferred upfront for scheduling ov. Initial call taken by: Carlye Grippe,  March 28, 2009 1:29 PM

## 2010-02-10 NOTE — Miscellaneous (Signed)
Summary: meds update  Clinical Lists Changes  Medications: Added new medication of HYDROCHLOROTHIAZIDE 25 MG TABS (HYDROCHLOROTHIAZIDE) Take one tablet by mouth daily. - Signed Added new medication of IMDUR 30 MG XR24H-TAB (ISOSORBIDE MONONITRATE) Take 1 tablet by mouth once a day - Signed Rx of HYDROCHLOROTHIAZIDE 25 MG TABS (HYDROCHLOROTHIAZIDE) Take one tablet by mouth daily.;  #30 x 6;  Signed;  Entered by: Hoover Brunette, LPN;  Authorized by: Lewayne Bunting, MD, [facility];  Method used: Electronically to [facility] [address] [place], [place]  [address], Ph: [phone], Fax: [phone] Rx of IMDUR 30 MG XR24H-TAB (ISOSORBIDE MONONITRATE) Take 1 tablet by mouth once a day;  #30 x 6;  Signed;  Entered by: Hoover Brunette, LPN;  Authorized by: Lewayne Bunting, MD, [facility];  Method used: Electronically to [facility] [address], [place], [place]  [address], Ph: [phone], Fax: [phone]    Prescriptions: IMDUR 30 MG XR24H-TAB (ISOSORBIDE MONONITRATE) Take 1 tablet by mouth once a day  #30 x 6   Entered by:   Hoover Brunette, LPN   Authorized by:   Lewayne Bunting, MD, [facility]   Signed by:   Hoover Brunette, LPN on 02/72/5366   Method used:   Electronically to        [facility] (retail)       [address]       [place], [place]  [address]       Ph: [phone]       Fax: [phone]   RxID:   [id number] HYDROCHLOROTHIAZIDE 25 MG TABS (HYDROCHLOROTHIAZIDE) Take one tablet by mouth daily.  #30 x 6   Entered by:   Hoover Brunette, LPN   Authorized by:   Lewayne Bunting, MD, [facility]   Signed by:   Hoover Brunette, LPN on 23/55/7322   Method used:   Electronically to        [facility] (retail)       [address]       [place], [place]  [address]       Ph: [phone]       Fax: [phone]   RxID:   [id number]  Darl Pikes called and said that these meds were not on his list when you saw him yesterday.  Please advise if okay to continue.  Will need refills sent to  [facility] & has about a week left. Hoover Brunette, LPN  May 14, 5007 8:58 AM   Yes patient needs to continue on these meds.  Lewayne Bunting, MD, [facility]  May 14, 2009 2:00 PM  Patient notified.   Meds sent to pharm above.   Hoover Brunette, LPN  May 14, 3816 5:27 PM

## 2010-02-10 NOTE — Miscellaneous (Signed)
Summary: Rehab Report/ FAXED CARDIAC REHAB REFERRAL  Rehab Report/ FAXED CARDIAC REHAB REFERRAL   Imported By: Dorise Hiss 06/20/2009 11:45:53  _____________________________________________________________________  External Attachment:    Type:   Image     Comment:   External Document

## 2010-02-10 NOTE — Miscellaneous (Signed)
Summary: Rehab Report/ CARDIAC REHAB PROGRESS REPORT  Rehab Report/ CARDIAC REHAB PROGRESS REPORT   Imported By: Dorise Hiss 07/24/2009 12:15:36  _____________________________________________________________________  External Attachment:    Type:   Image     Comment:   External Document

## 2010-02-10 NOTE — Progress Notes (Signed)
Summary: PHONE: TOOTH PROBLEM AND PLAVIX  Phone Note Call from Patient Call back at Home Phone [phone]   Caller: [facility] ANN Reason for Call: Talk to Doctor Summary of Call: Mr. Colquhoun is going to see his dentist today and may have to have a tooth pulled. Wants to know if he will need to be off Plavix before having tooth pulled. please call [name] ext. [phone] Initial call taken by: Zachary George,  September 25, 2009 10:21 AM  Follow-up for Phone Call        mr. Arias will be having his tooth pulled on 10-03-2009 They need to know about stoping his Plavix,. Follow-up by: Zachary George,  September 26, 2009 9:37 AM  Additional Follow-up for Phone Call Additional follow up Details #1::        can stop Plavix 7 days before and then resume after dental procedure Additional Follow-up by: Lewayne Bunting, MD, [facility],  September 28, 2009 3:43 PM    Additional Follow-up for Phone Call Additional follow up Details #2::    [name] notified.   Follow-up by: Hoover Brunette, LPN,  September 29, 2009 2:29 PM

## 2010-02-10 NOTE — Letter (Signed)
Summary: Engineer, materials at [facility]  [address]   [place], [place] [address]   Phone: [phone]  Fax: [phone]        September 10, 2009 MRN: [id number]   RAINER [place] [address] [place], [place]  [address]   Dear Mr. Hendershott,  Your test ordered by Selena Batten has been reviewed by your physician (or physician assistant) and was found to be normal or stable. Your physician (or physician assistant) felt no changes were needed at this time.  ____ Echocardiogram  ____ Cardiac Stress Test  __X__ Lab Work  ____ Peripheral vascular study of arms, legs or neck  ____ CT scan or X-ray  ____ Lung or Breathing test  ____ Other:   Thank you.   Hoover Brunette, LPN    Duane Boston, M.D., F.A.C.C. Thressa Sheller, M.D., F.A.C.C. Oneal Grout, M.D., F.A.C.C. Cheree Ditto, M.D., F.A.C.C. Daiva Nakayama, M.D., F.A.C.C. Kenney Houseman, M.D., F.A.C.C. Jeanne Ivan, PA-C

## 2010-02-13 NOTE — Miscellaneous (Signed)
Summary: Rehab Report/ CARDIAC REHAB PROGRESS REPORT  Rehab Report/ CARDIAC REHAB PROGRESS REPORT   Imported By: Dorise Hiss 06/23/2009 16:40:53  _____________________________________________________________________  External Attachment:    Type:   Image     Comment:   External Document

## 2010-03-02 ENCOUNTER — Encounter: Payer: Self-pay | Admitting: Cardiology

## 2010-03-02 ENCOUNTER — Ambulatory Visit (INDEPENDENT_AMBULATORY_CARE_PROVIDER_SITE_OTHER): Payer: PRIVATE HEALTH INSURANCE | Admitting: Cardiology

## 2010-03-02 DIAGNOSIS — I5022 Chronic systolic (congestive) heart failure: Secondary | ICD-10-CM

## 2010-03-02 DIAGNOSIS — I2510 Atherosclerotic heart disease of native coronary artery without angina pectoris: Secondary | ICD-10-CM

## 2010-03-10 NOTE — Assessment & Plan Note (Signed)
Summary: 6 MO FU PER REMINDER -VS/SRS   Visit Type:  Follow-up Referring Provider:  Dr. Harland Dingwall Primary Provider:  Dr. Margo Common  CC:  no cardiology complaints.  History of Present Illness: The patient is a 77 year old male with a history of coronary artery disease status post coronary bypass grafting.  Last year he underwent a cardiac catheterization and was found to have significant graft disease as well as native coronary artery disease.  It was felt he should be treated medically.  The patient continues to do well.  An echocardiogram last year demonstrated an ejection fraction of 40 to 45% with segmental wall motion abnormalities.  There were no significant valvular lesions.  The patient also has a history of renal insufficiency with a creatinine last year of 1.56.  He also has history of anemia which required iron therapy. the patient is doing quite well he denies any chest pain or shortness of breath.  He continues to actively participate in cardiac rehab. last year he had carotid Dopplers and showed less than 50% stenosis bilaterally.  Current Medications (verified): 1)  Glucophage 500 Mg Tabs (Metformin Hcl) .... Take 1 Tablet By Mouth Two Times A Day(On Hold) 2)  Norvasc 5 Mg Tabs (Amlodipine Besylate) .... Take 1 Tablet By Mouth Once A Day 3)  Glucotrol Xl 10 Mg Xr24h-Tab (Glipizide) .... Take 1 Tablet By Mouth Once A Day 4)  Aspirin 81 Mg Tbec (Aspirin) .... Take One Tablet By Mouth Daily 5)  Protonix 40 Mg Tbec (Pantoprazole Sodium) .... Take 1 Tablet By Mouth Once A Day 6)  Plavix 75 Mg Tabs (Clopidogrel Bisulfate) .... Take 1 Tablet By Mouth Once A Day 7)  Nitrostat 0.4 Mg Subl (Nitroglycerin) .... Place 1 Tablet Under Tongue As Directed 8)  Cardura 4 Mg Tabs (Doxazosin Mesylate) .... Take 1 Tablet By Mouth Two Times A Day 9)  Metanx 3-35-2 Mg Tabs (L-Methylfolate-B6-B12) .... Take 1 Tablet By Mouth Once A Day 10)  Pravastatin Sodium 80 Mg Tabs (Pravastatin Sodium) .... Take  1 Tab By Mouth At Bedtime 11)  Hydrochlorothiazide 25 Mg Tabs (Hydrochlorothiazide) .... Take One Tablet By Mouth Daily. 12)  Imdur 30 Mg Xr24h-Tab (Isosorbide Mononitrate) .... Take 1 Tablet By Mouth Once A Day 13)  Coreg 3.125 Mg Tabs (Carvedilol) .... Take 1 Tablet By Mouth Two Times A Day 14)  Ferrous Sulfate 325 (65 Fe) Mg Tbec (Ferrous Sulfate) .... Take 1 Tablet By Mouth Three Times A Day  Allergies (verified): 1)  Penicillin  Comments:  Nurse/Medical Assistant: patient brought med list we reviewed meds [facility] [place]  Past History:  Past Medical History: Last updated: 09/02/2009 1. Continued patency of the internal mammary to the LAD. 2. 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 3. Severe multiple high-grade lesions with large clot burden in the     saphenous vein graft to the OM with small distal target, and high-     grade in-stent restenosis. 4. Severe native coronary artery disease.  Status post coronary bypass grafting 2004 with a l to the LAD and the sac was vein graft obtuse marginal and a second vein graft to the diagonal.   Preserved LV function with an ejection fraction of 55% history of upper GI bleeding status post EGD and colonoscopy.   anticoagulation with aspirin and Plavix  iron deficiency anemia secondary to GI bleed improved with current therapy.  diabetes mellitus Chronic renal insufficiency rather than baseline 1.56 hypertension obesity degenerative joint disease history of postoperative  atrial fibrillation.  Family History: Last updated: 12/05/2007 notable for father dying of heart disease and mother is still living at age 72 has coronary artery disease. he has also a brother with coronary artery disease.  Social History: Last updated: 12/05/2007 the patient is married denies tobacco or alcohol use.  Risk Factors: Smoking Status: never (09/02/2009)  Review of Systems       The patient complains of weight gain/loss,  shortness of breath, and joint pain.  The patient denies fatigue, malaise, fever, vision loss, decreased hearing, hoarseness, chest pain, palpitations, prolonged cough, wheezing, sleep apnea, coughing up blood, abdominal pain, blood in stool, nausea, vomiting, diarrhea, heartburn, incontinence, blood in urine, muscle weakness, leg swelling, rash, skin lesions, headache, fainting, dizziness, depression, anxiety, enlarged lymph nodes, easy bruising or bleeding, and environmental allergies.    Vital Signs:  Patient profile:   77 year old male Weight:      265 pounds BMI:     35.09 Pulse rate:   50 / minute BP sitting:   144 / 74  (left arm)  Vitals Entered By: Dreama Saa, CNA (March 02, 2010 3:50 PM)  Physical Exam  Additional Exam:  General: Well-developed, well-nourished in no distress head: Normocephalic and atraumatic eyes PERRLA/EOMI intact, conjunctiva and lids normal nose: No deformity or lesions mouth normal dentition, normal posterior pharynx neck: Supple, no JVD.  No masses, thyromegaly or abnormal cervical nodes lungs: Normal breath sounds bilaterally without wheezing.  Normal percussion heart: regular rate and rhythm with normal S1 and S2, no S3 or S4.  PMI is normal.  No pathological murmurs abdomen: Normal bowel sounds, abdomen is soft and nontender without masses, organomegaly or hernias noted.  No hepatosplenomegaly musculoskeletal: Back normal, normal gait muscle strength and tone normal pulsus: Pulse is normal in all 4 extremities Extremities: No peripheral pitting edema neurologic: Alert and oriented x 3 skin: Intact without lesions or rashes cervical nodes: No significant adenopathy psychologic: Normal affect    Impression & Recommendations:  Problem # 1:  DYSLIPIDEMIA (ICD-272.4) recheck a lipid panel and liver function tests His updated medication list for this problem includes:    Pravastatin Sodium 80 Mg Tabs (Pravastatin sodium) .Marland Kitchen... Take 1 tab by  mouth at bedtime  Future Orders: T-Hepatic Function [phone]) ... 03/30/2010 T-Lipid Profile [id number]) ... 03/30/2010  Problem # 2:  RENAL FAILURE, CHRONIC (ICD-585.9) patient has renal insufficiency and we will check a  creatinine Future Orders: T-Basic Metabolic Panel [phone]) ... 03/30/2010  Problem # 3:  ANEMIA (ICD-285.9) followed by the patient's primary care physician  Problem # 4:  LEFT VENTRICULAR FUNCTION, DECREASED (ICD-429.2) LV function is approximately 40 to 45%.  I did a quick bedside scan today in the office and I suspect that the ejection fraction is approximately stable blood images were quite poor.  The patient will get a formal echocardiogram in 6 months.  He will need to continue his ejection fraction carefully Orders: 2-D Echocardiogram (2D Echo)  Problem # 5:  CAROTID BRUIT, RIGHT (ICD-785.9) carotid Doppler showed less than 50% stenosis bilaterally and they do not need to be repeated this year.  Problem # 6:  CORONARY ARTERY BYPASS GRAFT, HX OF (ICD-V45.81) the patient has history of coronary artery disease status post coronary bypass grafting  is significant graft disease but has no recurrent chest pain.  Other Orders: EKG w/ Interpretation ([id number])  Patient Instructions: 1)  Your physician recommends that you return for a FASTING lipid/liver profile & BMET: [facility] IN 4  WEEKS. 2)  Your physician has requested that you have an echocardiogram.  Echocardiography is a painless test that uses sound waves to create images of your heart. It provides your doctor with information about the size and shape of your heart and how well your heart's chambers and valves are working.  This procedure takes approximately one hour. There are no restrictions for this procedure. 3)  Your physician wants you to follow-up in:6 months. You will receive a reminder letter in the mail about two months in advance. If you don't receive a letter, please call our office to  schedule the follow-up appointment. Prescriptions: CARDURA 4 MG TABS (DOXAZOSIN MESYLATE) Take 1 tablet by mouth two times a day  #60 x 6   Entered by:   Carlye Grippe   Authorized by:   Lewayne Bunting, MD, [facility]   Signed by:   Carlye Grippe on 03/02/2010   Method used:   Electronically to        [facility] (retail)       [address]       [place], [place]  [address]       Ph: [phone]       Fax: [phone]   RxID:   [id number] COREG 3.125 MG TABS (CARVEDILOL) Take 1 tablet by mouth two times a day  #60 x 6   Entered by:   Carlye Grippe   Authorized by:   Lewayne Bunting, MD, [facility]   Signed by:   Carlye Grippe on 03/02/2010   Method used:   Electronically to        [facility] (retail)       [address]       [place], [place]  [address]       Ph: [phone]       Fax: [phone]   RxID:   [id number] PRAVASTATIN SODIUM 80 MG TABS (PRAVASTATIN SODIUM) Take 1 tab by mouth at bedtime  #30 Tablet x 6   Entered by:   Carlye Grippe   Authorized by:   Lewayne Bunting, MD, [facility]   Signed by:   Carlye Grippe on 03/02/2010   Method used:   Electronically to        [facility] (retail)       [address]       [place], [place]  [address]       Ph: [phone]       Fax: [phone]   RxID:   [id number]

## 2010-03-10 NOTE — Miscellaneous (Signed)
Summary: Rehab Report/ REHAB VISIT  Rehab Report/ REHAB VISIT   Imported By: Dorise Hiss 03/02/2010 14:59:47  _____________________________________________________________________  External Attachment:    Type:   Image     Comment:   External Document

## 2010-03-31 LAB — CBC
Hemoglobin: 10 g/dL — ABNORMAL LOW (ref 13.0–17.0)
MCHC: 34 g/dL (ref 30.0–36.0)
RBC: 3.56 MIL/uL — ABNORMAL LOW (ref 4.22–5.81)
RBC: 3.61 MIL/uL — ABNORMAL LOW (ref 4.22–5.81)
WBC: 6 10*3/uL (ref 4.0–10.5)

## 2010-03-31 LAB — FERRITIN: Ferritin: 53 ng/mL (ref 22–322)

## 2010-03-31 LAB — PROTIME-INR
INR: 1.21 (ref 0.00–1.49)
Prothrombin Time: 15.2 seconds (ref 11.6–15.2)

## 2010-03-31 LAB — BASIC METABOLIC PANEL
BUN: 28 mg/dL — ABNORMAL HIGH (ref 6–23)
BUN: 30 mg/dL — ABNORMAL HIGH (ref 6–23)
CO2: 26 mEq/L (ref 19–32)
CO2: 29 mEq/L (ref 19–32)
Calcium: 8.5 mg/dL (ref 8.4–10.5)
Chloride: 106 mEq/L (ref 96–112)
Chloride: 109 mEq/L (ref 96–112)
Creatinine, Ser: 1.55 mg/dL — ABNORMAL HIGH (ref 0.4–1.5)
Creatinine, Ser: 1.69 mg/dL — ABNORMAL HIGH (ref 0.4–1.5)
GFR calc Af Amer: 48 mL/min — ABNORMAL LOW (ref >60)
GFR calc non Af Amer: 37 mL/min — ABNORMAL LOW (ref >60)
GFR calc non Af Amer: 40 mL/min — ABNORMAL LOW (ref >60)
Glucose, Bld: 110 mg/dL — ABNORMAL HIGH (ref 70–99)
Glucose, Bld: 159 mg/dL — ABNORMAL HIGH (ref 70–99)
Potassium: 4.4 mEq/L (ref 3.5–5.1)
Potassium: 5 mEq/L (ref 3.5–5.1)
Sodium: 141 mEq/L (ref 135–145)

## 2010-03-31 LAB — GLUCOSE, CAPILLARY
Glucose-Capillary: 101 mg/dL — ABNORMAL HIGH (ref 70–99)
Glucose-Capillary: 116 mg/dL — ABNORMAL HIGH (ref 70–99)
Glucose-Capillary: 158 mg/dL — ABNORMAL HIGH (ref 70–99)
Glucose-Capillary: 166 mg/dL — ABNORMAL HIGH (ref 70–99)
Glucose-Capillary: 184 mg/dL — ABNORMAL HIGH (ref 70–99)
Glucose-Capillary: 215 mg/dL — ABNORMAL HIGH (ref 70–99)

## 2010-03-31 LAB — IRON AND TIBC
Iron: 28 ug/dL — ABNORMAL LOW (ref 42–135)
Saturation Ratios: 10 % — ABNORMAL LOW (ref 20–55)
TIBC: 292 ug/dL (ref 215–435)

## 2010-03-31 LAB — APTT: aPTT: 31 seconds (ref 24–37)

## 2010-03-31 LAB — LIPID PANEL
HDL: 23 mg/dL — ABNORMAL LOW (ref >39)
Total CHOL/HDL Ratio: 4.7 RATIO

## 2010-04-01 ENCOUNTER — Other Ambulatory Visit: Payer: Self-pay | Admitting: Cardiology

## 2010-04-09 ENCOUNTER — Encounter: Payer: Self-pay | Admitting: Cardiology

## 2010-04-09 NOTE — Telephone Encounter (Signed)
[place] 

## 2010-04-22 ENCOUNTER — Other Ambulatory Visit: Payer: Self-pay | Admitting: Cardiology

## 2010-04-22 ENCOUNTER — Telehealth: Payer: Self-pay | Admitting: *Deleted

## 2010-04-22 NOTE — Telephone Encounter (Signed)
Patient notified of results on labs.

## 2010-04-22 NOTE — Telephone Encounter (Signed)
Message copied by Hoover Brunette on Wed Apr 22, 2010  2:46 PM ------      Message from: Lewayne Bunting      Created: Fri Apr 17, 2010  5:47 AM       Renal and hepatic function are stable. Triglycerides remain very high- try to cut back on carbohydrates. He is already on high dose statin, would probably not add other drug to lower TG. Needs f/u on his CBC also as he is still taking Iron but he can discuss this with his LMD.

## 2010-04-24 ENCOUNTER — Other Ambulatory Visit: Payer: Self-pay | Admitting: Cardiology

## 2010-04-24 NOTE — Telephone Encounter (Signed)
Made attempt to call wife and she left work at 4pm,will try patient at home.

## 2010-04-27 ENCOUNTER — Other Ambulatory Visit: Payer: Self-pay | Admitting: *Deleted

## 2010-04-27 MED ORDER — FERROUS SULFATE 325 (65 FE) MG PO TABS: 325.0000 mg | ORAL_TABLET | Freq: Three times a day (TID) | ORAL | Status: DC

## 2010-04-27 NOTE — Telephone Encounter (Signed)
Wife informed that ferrous sulfate sent to pharmacy.

## 2010-05-29 NOTE — H&P (Signed)
[facility]  Patient:    Benjamin [name], Benjamin [name]                         MRN: [id number] Adm. Date:  [id number] Attending:  Jonne Ply                         History and Physical  ADMITTING DIAGNOSES: 1. Herniated nucleus pulposus, L3-4 right with right lumbar radiculopathy. 2. Diabetes mellitus. 3. Lumbar spondylosis and stenosis with neurogenic claudication, status post    lumbar laminectomy, 1999. 4. Hypertension.  HISTORY OF PRESENT ILLNESS:  The patient is a 77 year old individual, who a little over a week ago, rather insidiously developed the onset of right lower extremity pain that was quite severe and unrelenting.  The pain became so severe that he was seen in the emergency room and an MRI was ordered about five days ago.  The study demonstrated an herniated nucleus pulposus of the L3-4 level concentric to the right side.  The patient was seen in the office yesterday and was found to have weakness in the quadriceps on the right side. He was advised regarding surgical extirpation of the disk.  He is not having good pain relief despite strong narcotic pain medications.  He is now admitted to undergo cervical diskectomy.  PAST MEDICAL HISTORY:  Noted for diabetes.  CURRENT MEDICATIONS: 1. Glucophage 500 mg three times a day. 2. Glucotrol XL 10 mg b.i.d. 3. Cardura 4 mg a day. 4. He also takes some atenolol and Cardizem.  SOCIAL HISTORY:  Reveals age 54.  He is retired.  He has diabetes.  REVIEW OF SYSTEMS:  Notable for back and left lower extremity pain on 14-point review sheet discussed with patient.  PHYSICAL EXAMINATION:  VITAL SIGNS:  Physical examination reveals his blood pressure is 180/82, heart rate is 66 and regular, respirations 16.  NEUROLOGICAL:  The patient is alert, oriented, in severe pain and distress with pain in the buttock and right lower extremity, tends to maintain a 10 degree forward stoop.  He will not  straighten to the erect position.  He has a marked amount of paravertebral spasm when he stands.  Motor strength in the lower extremities reveals that there is quadriceps weakness on the right side at 4/5.  The other muscle groups are all within the limits of normal.  No atrophy is noted in the major muscle groups.  Sensation is diminished over the dorsum of the left thigh.  Straight leg raising is markedly positive on the right side at 15 degrees, negative on the left side at 60 degrees.  Patricks maneuver is negative bilaterally.  His upper extremity strength and reflexes are normal.  Cranial nerve examination is normal.  Pupils are 4 mm, brisk react to light and accommodation.  The extraocular movements are full and face is symmetric to grimace.  Tongue and uvula are in the midline.  NECK:  Supple.  No masses are palpable.  No bruits are heard.  LUNGS:  Clear to auscultation.  HEART:  Regular rate and rhythm.  ABDOMEN:  Soft, protuberant.  Bowel sounds are positive.  No masses are palpable.  EXTREMITIES:  Reveal no clubbing, cyanosis, or edema.  BACK:  There is a well-healed midline incision from a previous laminectomy in 1999.  IMPRESSION:  The patient has significant disk herniation at L3-4 on the right side with a right lumbar radiculopathy in the L4  distribution.  PLAN:  He is now being admitted to undergo surgical extirpation of a rather large size fragment of disk from the L3-4 space. DD:  04/26/00 TD:  04/27/00 Job: [id number]

## 2010-05-29 NOTE — Discharge Summary (Signed)
NAME:  Benjamin [name], Benjamin [name] NO.:  [id number]   MEDICAL RECORD NO.:  [id number]          PATIENT TYPE:  INP   LOCATION:  6524                         FACILITY:  [facility]   PHYSICIAN:  Willa Rough, M.D.     DATE OF BIRTH:  Sep 27, 1933   DATE OF ADMISSION:  12/12/2004  DATE OF DISCHARGE:                                 DISCHARGE SUMMARY   PRIMARY DIAGNOSES:  1.  Non-ST segment elevation myocardial infarction status post cardiac      catheterization with percutaneous transluminal coronary angioplasty and      a Taxus stent to the distal circumflex.  2.  Residual coronary artery disease of 90% proximal right coronary artery      that is totally ___________ distally, medical therapy recommended.  3.  Status post aortocoronary bypass surgery in 2004 with left internal      mammary artery to the left anterior descending, saphenous vein graft to      obtuse marginal and saphenous vein graft to diagonal. All grafts patent      at catheterization.  4.  Preserved left ventricular function with an ejection fraction of 55% at      cardiac catheterization this admission.  5.  Upper gastrointestinal bleed with bleeding gastric nodule cauterized on      esophagogastroduodenoscopy and colonoscopy recommended once he recovers      from his myocardial infarction.  6.  Anticoagulation with full strength aspirin and Plavix secondary to drug      eluting stent.  7.  Iron deficiency anemia secondary to gastrointestinal bleed, continue      multivitamin with iron and followup with primary medical doctor.  8.  Diabetes.  9.  Hypertension.  10. Obesity.  11. Degenerative joint disease.  12. History of postoperative atrial fibrillation.   TIME SPENT AT DISCHARGE:  Medical doctor and PA time at discharge was 52  minutes.   PROCEDURE:  1.  Cardiac catheterization.  2.  Coronary arteriogram.  3.  Left ventriculogram.  4.  Graft angiogram.  5.  LIMA arteriogram.  6.   Esophagogastroduodenoscopy with biopsy (results pending at the time of      dictation).   HOSPITAL COURSE:  Benjamin [name] is a 77 year old male with known coronary  artery disease. He had bypass surgery in 2004 and has a prior inferior  myocardial infarction. On the several days prior to admission, he was  undergoing evaluation for abdominal pain, which included an abdominal  ultrasound, which was negative. On the day of admission, he developed mid  epigastric and left chest pain that radiated around to his back. His cardiac  enzymes were elevated and he was transferred from [facility] to [facility] for further evaluation and treatment.   He ruled in for a non-ST segment elevation MI with a CK MB peak of 347/14.6.  His troponins were negative. He was taken to the cath lab and the culprit  lesion was felt to be the OM, which had a 90% stenosis distal to the  anastomosis of the vein graft. This was treated with PTCA and  a Taxus stent,  reducing the stenosis to zero. He tolerated the procedure well.   Benjamin [name] was anemic the day after his procedure with a hemoglobin of 8.2.  He was trace guaiac positive and a GI consult was called. He was evaluated  by Dr. Marina Goodell and it was felt that EGD was indicated. This was performed on  December 16, 2004. The EGD showed a fleshy nodule in the cardia, just below  the EG junction, which was actively oozing blood. A biopsy was taken and  then hemostatic therapy was provided. He is to be on Protonix indefinitely  and be closely monitored for re-bleeding. Benjamin [name] hemoglobin dropped to  7.9 with a hematocrit of 22.8. He was transfused and his hemoglobin improved  to 9.1.   On December 17, 2004, Benjamin [name] was evaluated by Dr. Myrtis Ser. The situation was  discussed with the patient and his wife, who understand that he needs to be  monitored closely for re-bleeding. They also understand the benefits of full  strength aspirin and Plavix and he is to  continue those at this time. He  will be followed closely by cardiology and with his primary care physician.  Benjamin [name] was ambulating without chest pain or shortness of breath and  considered stable for discharge on December 17, 2004 with outpatient followup  arranged.   DISCHARGE INSTRUCTIONS:  1.  His activity level is to be increased gradually.  2.  He is to call the office for problems with the catheterization site.  3.  He is to followup with Dr. Diona Browner on December 22, 2004 and get blood      work drawn prior to that. He is to get the CBC and B-met drawn prior to      that. He is also to get a lipid profile and liver function testing done      on January 15, 2004 at 8:30. He is to followup with Dr. Marina Goodell on January 05, 2005 at 1:45. He is to followup with Dr. Margo Common as needed or as      scheduled.   DISCHARGE MEDICATIONS:  1.  Protonix 40 mg a day.  2.  Plavix 75 mg a day.  3.  Glucotrol XL 10 mg b.i.d.  4.  Glucophage 500 mg daily a.c., resume December 18, 2004.  5.  Aspirin 325 mg a day, coated recommended.  6.  Cardura 4 mg b.i.d.  7.  Clonidine 0.1 mg b.i.d.  8.  Lipitor 20 mg . q. p.m.  9.  Lopressor 25 mg b.i.d.  10. Multivitamin with iron daily.  11. HCTZ, is on hold for now.      Theodore Demark, P.A. [facility]    ______________________________  Willa Rough, M.D.    RB/MEDQ  D:  12/17/2004  T:  12/17/2004  Job:  [id number]   cc:   [facility]  [place], [place]. Marina Goodell, M.D. [facility]  [address]  [place]  [place] [address]   Wyvonnia Lora  Fax: [phone]

## 2010-05-29 NOTE — Op Note (Signed)
NAME:  SADAO, WEYER NO.:  [id number]   MEDICAL RECORD NO.:  [id number]                   PATIENT TYPE:  INP   LOCATION:  3172                                 FACILITY:  [facility]   PHYSICIAN:  Stefani Dama, M.D.               DATE OF BIRTH:  1933-01-13   DATE OF PROCEDURE:  10/12/2001  DATE OF DISCHARGE:                                 OPERATIVE REPORT   PREOPERATIVE DIAGNOSIS:  Cervical spondylosis with myelopathy, C5-C6 and C6-  C7.   POSTOPERATIVE DIAGNOSIS:  Cervical spondylosis with myelopathy, C5-C6 and C6-  C7.   PROCEDURE:  Anterior cervical diskectomy and arthrodesis with structural  allograft Synthes plate fixation, C5-C6 and C6-C7.   SURGEON:  Stefani Dama, M.D.   FIRST ASSISTANT:  Tanya Nones. Jeral Fruit, M.D.   ANESTHESIA:  General endotracheal.   The patient is a 77 year old individual who has had severe spondylitic  myelopathy with cord compression and cervical radiculopathy in C5-C6 and C6-  C7 levels.  He has been advised to undergo surgical decompression of the  same.   PROCEDURE:  The patient was brought to the operating room and placed on the  table in the supine position.  After smooth induction of general  endotracheal anesthesia, he was placed in five pounds of Holter traction.  The neck was shaved, prepped with DuraPrep and draped in a sterile fashion.   A transverse incision was made in the left side of the neck and carried down  through the platysma.  The plane between the sternocleidomastoid and the  strap muscles was dissected bluntly until the prevertebral space was  reached.  The first identifiable disk space was noted to be that of C5-C6.  Ventral osteophytes were cleared from this area and self-retraining Kaspar  retractor was placed in the wound.  Diskectomy was performed at C5-C6  removing a significant quantity of severely degenerated and desiccated disk  material.  As the posterior longitudinal ligament  was reached, there was  noted to be significant osteophytic overgrowth particularly on the left  side.  This required a very delicate and piecemeal resection.  During this  time Dr. Jeral Fruit helped by providing suction and retraction on the  ipsilateral side of the neck.  The dissection was eventually accomplished  using a high-speed air drill and a 2.2-mm dissecting tool to remove the bulk  of the osteophytic overgrowth which was very adherent to the superior margin  of the body of C6 on the left side.  Once this was resected and both nerve  roots were cleared laterally, the area was checked for hemostasis and then  an 8-mm tricortical graft was placed with cortical surface facing dorsally.   Attention was then turned to C6-C7 where a similar procedure was carried  out.  Here the largest osteophyte was on the right side.  This was dissected  down and ultimately a  similar decompression was carried out as it had been  at C6-C7.  Dr. Jeral Fruit assisted in the same fashion.  The ventral aspects of  the graft were trimmed.  A 43-mm Synthes plate was then contoured to the  prevertebral space and affixed with six locking 4 x 14-mm screws.  A  radiographic confirmation of the construct was obtained.   The area was checked for hemostasis.  Once this was secured, the platysma  was closed with 3-0 Vicryl in an interrupted fashion.  Vicryl, 3-0, was used  to close the subcuticular tissues.  The patient tolerated the procedure well  and was returned to the recovery room in stable condition.                                               Stefani Dama, M.D.    Merla Riches  D:  10/12/2001  T:  10/13/2001  Job:  [id number]

## 2010-05-29 NOTE — Cardiovascular Report (Signed)
NAME:  Benjamin [name], Benjamin [name] NO.:  [id number]   MEDICAL RECORD NO.:  [id number]                   PATIENT TYPE:  INP   LOCATION:  2020                                 FACILITY:  [facility]   PHYSICIAN:  Carole Binning, M.D. [facility]         DATE OF BIRTH:  [birth year]   DATE OF PROCEDURE:  09/21/2002  DATE OF DISCHARGE:                              CARDIAC CATHETERIZATION   PROCEDURE PERFORMED:  Left heart catheterization with coronary angiography,  left ventriculography, and abdominal aortography.   INDICATIONS:  Benjamin [name] is a 77 year old male with diabetes and history of  coronary artery disease status post stents to the left anterior descending  artery and left circumflex.  He was admitted to [facility] with  unstable angina and transferred to [facility] for cardiac catheterization.   PROCEDURAL NOTE:  A 6-French sheath was placed in the right femoral artery.  Coronary angiography was performed using a 6-French JL5 and JR4 catheters.  Left ventriculography and abdominal aortography were performed with an  angled pigtail catheter.  Contrast was Omnipaque.  There were no  complications.   RESULTS:  HEMODYNAMICS:  Left ventricular pressure 156/22.  Central aortic pressure 176/70.  There is  no aortic valve gradient.   LEFT VENTRICULOGRAM:  Wall motion is normal.  Ejection fraction estimated at greater than or equal  to 65%.  There is no mitral regurgitation.   ABDOMINAL AORTOGRAM:  Normal renal arteries, abdominal aortic, and iliac arteries.   CORONARY ARTERIOGRAPHY:  (Codominant)  Left main is normal.   Left anterior descending artery has a stent in the proximal vessel with a  tubular 30% stenosis within the stent.  In the mid LAD there is a tubular  90% stenosis extending across the origin of the first and second diagonal  branches.  The distal LAD has a diffuse 30% stenosis.  The LAD gives rise to  a small sized first diagonal branch, normal  sized second diagonal branch,  and normal sized third diagonal branch.  The second diagonal branch has a  60% stenosis at its origin.   Left circumflex is a very large vessel giving rise to a small first marginal  and very large second marginal with several subbranches which supplies the  majority of the inferolateral wall.  There is a stent in the proximal  circumflex which is widely patent.  Just beyond the stent there is a 40%  stenosis in the mid circumflex.  Further down in the mid circumflex there is  a discreet 70% stenosis.  In the proximal portion of the large second  marginal branch there is a tubular 80% stenosis which involves one of the  subbranches.  Just beyond this there is a 50% stenosis in the mid portion of  the second obtuse marginal.   Right coronary artery is a relatively small vessel.  There is an 80%  stenosis in the proximal vessel and 100%  occlusion of the distal vessel with  poor collateral filling of the vessel beyond this.  There is a large right  ventricular branch arising from the proximal right coronary artery.   IMPRESSION:  1. Preserved left ventricular systolic function.  2. Three vessel coronary artery disease consisting of complex bifurcational     disease of the left anterior descending artery as well as the large     second obtuse marginal branch which supplies a large territory of     myocardium.  In addition, there is a chronic total occlusion of the small     distal right coronary artery.   RECOMMENDATIONS:  Based on the nature of the patient's three vessel coronary  artery disease and underlying diabetes mellitus, he will be referred for  evaluation for coronary artery bypass surgery.                                                Carole Binning, M.D. [facility]    MWP/MEDQ  D:  09/21/2002  T:  09/21/2002  Job:  [id number]   cc:   Benjamin [name], M.D.  [facility]   Cath Lab

## 2010-05-29 NOTE — Op Note (Signed)
NAME:  Benjamin [name], Benjamin [name] NO.:  [id number]   MEDICAL RECORD NO.:  [id number]                   PATIENT TYPE:  INP   LOCATION:  2315                                 FACILITY:  [facility]   PHYSICIAN:  Salvatore Decent. Dorris Fetch, M.D.         DATE OF BIRTH:  [birth year]   DATE OF PROCEDURE:  09/24/2002  DATE OF DISCHARGE:                                 OPERATIVE REPORT   PREOPERATIVE DIAGNOSIS:  Three vessel coronary artery disease, status post  MI.   POSTOPERATIVE DIAGNOSIS:  Three vessel coronary artery disease, status post  MI.   PROCEDURE:  Median sternotomy, extracorporeal circulation, coronary artery  bypass grafting x3 (LIMA to LAD, saphenous vein graft to second diagonal,  saphenous vein graft to obtuse marginal 1), endoscopic vein harvest left  thigh.   SURGEON:  Salvatore Decent. Dorris Fetch, M.D.   ASSISTANTSalvatore Decent. Cornelius Moras, M.D. and Jani Gravel Royston Sinner, N.P.   ANESTHESIA:  General.   FINDINGS:  Saphenous vein large, but good quality.  Mammary small vessel,  but good flow.  Second diagonal fair quality target. LAD and OM1 good  quality targets.  No graftable targets in the distal coronary distribution.  Inferior scar. Left ventricular hypertrophy.   INDICATIONS FOR PROCEDURE:  The patient is a 77 year old gentleman with  known coronary artery disease. He also has adult onset diabetes mellitus.  He presented with acute MI with positive enzyme changes.  At  catheterization, he had severe three vessel coronary artery disease not  amenable to percutaneous intervention.  The patient was referred for  coronary artery bypass grafting.  The indications, risks, benefits, and  alternatives were discussed in detail with the patient by Dr. Tyrone Sage.  The  patient understood and accepted the risks and agreed to proceed.  I met with  him an examined the patient the morning of surgery.  He understood all  issues involved except for the risks and agreed to  proceed.   DESCRIPTION OF PROCEDURE:  The patient was brought to the preoperative  holding area on September 24, 2002.  Lines were placed to monitor arterial,  central venous, and pulmonary arterial pressure.  Intravenous antibiotics  were administered. The patient was taken to the operating room,  anesthetized, and intubated. A Foley catheter was placed.  The chest,  abdomen, and legs were prepped and draped in the usual sterile fashion.   A median sternotomy was performed.  The left internal mammary artery was  harvested using the standard technique.  It was relatively small in caliber.  Simultaneously, an incision was made in the medial aspect of the left leg at  the level of the knee.  The greater saphenous vein was identified and was  harvested from the thigh endoscopically.  It was relatively large in  caliber, but was of suitable quality for use as a graft.  The patient was  fully heparinized prior to dividing the distal end  of the mammary artery.  There was good flow through the mammary.  A 1.5 mm probe did pass through  the vessel without resistance.   The pericardium was opened.  The ascending aorta was inspected and palpated.  There was no palpable atherosclerotic disease. The aorta was cannulated via  concentric 2-0 Ethibond pledgeted pursestring sutures. A dual stage venous  cannula was placed via pursestring suture in the right atrial appendage.  Cardiopulmonary bypass was instituted and the patient was cooled to 32  degrees Celsius.  The coronary arteries were inspected and anastomotic sites  were chosen.  Of note, in the distal right coronary distribution, there were  no graftable vessels.  The distal right itself was heavily calcified, 1 mm  vessel.  The conduits were inspected and cut to length.  A foam pad was  placed in the pericardium to protect the left phrenic nerve.  A temperature  probe was placed in the myocardial septum and a cardioplegia cannula was  placed  in the ascending aorta.   The aorta was crossclamped. The left ventricle was emptied via the aortic  root vent.  Cardiac arrest then was achieved with a combination of cold  antegrade blood cardioplegia and topical iced saline.  After achieving a  complete diastolic arrest and adequate myocardial septal cooling, the  following distal anastomoses were performed.   First a reversed saphenous vein graft was placed end-to-side to the second  diagonal branch to the LAD.  This was a 1.5 mm fair quality vessel.  Only a  1 mm probe passed distally.  The vein graft was a large caliber and of  satisfactory quality. The anastomosis was performed with a running 7-0  Prolene suture.  There was excellent flow through the graft. Cardioplegia  was administered.  A small leak at the lateral aspect of the anastomosis was  repaired with a single 7-0 Prolene suture.   Next, a reversed saphenous vein graft was placed end-to-side to the first  obtuse marginal branch of the left circumflex coronary artery.  This was a 2  mm vessel.  It was heavily diseased proximally.  A 1.5 mm probe passed  easily distally.  A vein graft again was of large caliber, satisfactory  quality.  The anastomosis was performed with a running 7-0 Prolene suture.  There was excellent flow through the graft.  Cardioplegia was administered.  There was good hemostasis at the anastomosis.  Of note, OM1 and the  remaining OM vessels were intramyocardial.   Next the left internal mammary artery was brought through a window in the  pericardium.  The distal end was spatulated.  It was then anastomosed end-to-  side to the LAD.  The LAD was a 1.8 mm vessel.  It was of good quality at  the site of the anastomosis which was distal to the takeoff of the third  diagonal.  The end-to-side anastomosis was performed with a running 8-0  Prolene suture.  At completion of the mammary to the LAD anastomosis, Bulldog clamp was removed to inspect for  hemostasis. The mammary pedicle was  tacked to the epicardial surface of the heart. The Bulldog clamp was  replaced and additional cardioplegia was administered. The vein grafts were  cut to length. The cardioplegia cannula was removed from the ascending aorta  and the proximal vein graft anastomoses were performed to 4.4 mm punch  aortotomies with a running 6-0 Prolene suture.  AT the completion of the  final proximal anastomosis, deairing maneuvers  were performed. The Bulldog  clamp was removed from the mammary artery. Septal rewarming was noted.  Lidocaine was administered. After completely deairing the aortic root, the  aortic crossclamp was removed. The total crossclamp time was 53 minutes.   All proximal and distal anastomoses were inspected for hemostasis while the  patient was being rewarmed.  Epicardial pacing wires were placed on the  right ventricle and right atrium. The patient was atrially paced for rate.  When the core temperature reached 37 degrees Celsius, the patient was weaned  from cardiopulmonary bypass without difficulty.  The total bypass time was  97 minutes. He weaned from bypass without inotropic support.  Initial  cardiac index was greater than 2 liters per minute per meter square and the  patient remained hemodynamically stable throughout the post bypass period.   Test dose of Protamine was administered and was well tolerated. The atrial  and aortic cannuli were removed. The remainder of the Protamine was  administered without incidence.  The chest was irrigated with 1 liter of  warm normal saline containing 1 gram of Vancomycin. Hemostasis was achieved.  The left pleural and two mediastinal chest tubes were placed through  separate subcostal incisions. The pericardium was reapproximated with  interrupted 3-0 silk sutures and came together easily without tension.  The  sternum was closed with a heavy gage stainless steel wires.  The pectoralis  was closed with  running #1 Vicryl suture.  The remainder of the incisions  were closed in  standard fashion with subcuticular skin closure. All needle, sponge, and  instrument count correct at the end of the procedure.  The patient remained  hemodynamically stable and was taken from the operating room to the surgical  intensive care unit in critical, but stable condition.                                               Salvatore Decent Dorris Fetch, M.D.    SCH/MEDQ  D:  09/24/2002  T:  09/24/2002  Job:  [id number]   cc:   Jonelle Sidle, M.D. [facility]   Regina Eck, M.D.

## 2010-05-29 NOTE — Consult Note (Signed)
NAME:  Benjamin, [name] NO.:  [id number]   MEDICAL RECORD NO.:  [id number]          PATIENT TYPE:  INP   LOCATION:  3028                         FACILITY:  [facility]   PHYSICIAN:  Lonia Blood, M.D.DATE OF BIRTH:  27-Dec-1933   DATE OF CONSULTATION:  05/06/2004  DATE OF DISCHARGE:                                   CONSULTATION   REASON FOR CONSULTATION:  Inpatient management of medical issues in a 77-  year-old with a complex medical [name].   [name] OF THE PRESENT ILLNESS:  Benjamin [name] is a very pleasant 3-  year-old gentleman who has been admitted to [facility] by Dr. Barnett Abu for lumbar spinal decompression status post myelogram.  I was asked  by Dr. Danielle Dess to evaluate the patient's medical issues and to assure that  they remain stable throughout his hospital stay.  Benjamin [name] does have a  [name] of coronary artery disease.  He is followed by Dr. Sherolyn Buba in  [facility].  Dr. Louis Matte has seen the patient and cleared him for surgery as  evidenced by a note to this extent found within the hospital chart.   I visited [name] in hospital bed on 3000 at [facility].  At  this time he has no complaints whatsoever.  He specifically denies chest  pain, shortness of breath, fever, chills, nausea, vomiting, and dyspnea on  exertion.  He has a regular appetite and no recent weight loss.  He does  report significant back pain and gait instability as detailed per Dr.  Verlee Rossetti note.  These symptoms have led to the evaluation, which has  ultimately led to the decision of the lumbar decompression.   REVIEW OF SYSTEMS:  The full review of systems is negative with the  exception of that mentioned in the [name] of present illness noted above.   PAST MEDICAL [name]:  1.  Severe three-vessel coronary artery disease status post coronary artery      bypass graft times three in September 2004.  2.  Hypertension.  3.  Hyperlipidemia.  4.  Diabetes mellitus Type 2 - usually very well-controlled on Glucotrol and      Glucophage.  5.  Status post decompression with stabilization at L3, L4, and L4, L5 in      April 2002.  6.  Status post anterior cervical diskectomy at C5 to C6 and C6 to C7 in      October 2003.  7.  Mild chronic renal insufficiency per review of medical records with a      baseline creatinine of 1.3 in September 2004.   MEDICATIONS:  Outpatient medications include:  1.  Clonidine 0.1 mg p.o. b.i.d.  2.  Cardura 4 mg p.o. b.i.d.  3.  Glucotrol 10 mg p.o. b.i.d.  4.  HCTZ 25 mg p.o. daily  5.  Glucophage 500 mg p.o. t.i.d.  6.  Aspirin 81 mg p.o. b.i.d.  7.  Vitamin E daily.  8.  Fiber b.i.d.  9.  Multivitamin daily.   ALLERGIES:  PENICILLIN LEADS TO A RASH.  PERCOCET CAUSES SEVERE CONFUSION.  ALTACE CAUSES STEVENS-JOHNSON TYPE REACTION per the patient's [name].   FAMILY [name]:  The family [name] is noncontributory to this admission.   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  Her is married and  his wife accompanied him to the hospital.  He does not smoke.  He does not  drink nor has he ever.   DATA REVIEWED:  Electrolytes are balanced; potassium is mildly elevated at  5.4, BUN is elevated at 27, creatinine is 1.3 and serum glucose 193.  Calcium was 9.1.  White count is 4.0, hemoglobin is 12, MCV is 81 and  platelets are 186,000.  Chest x-ray reveals cardiomegaly with mild chronic  interstitial lung disease, but no acute disease process. EKG is normal sinus  rhythm with a rate of 52 beats/minute and prolonged PR interval at 216 msec.  There is T wave inversion in leads 1, aVL, and V5 and V6 consistent with  previous lateral infarct. There is no evidence of acute ST or T wave change  to suggest ongoing cardiac ischemia.   PHYSICAL EXAMINATION:  VITAL SIGNS:  Temperature 97.0, blood pressure  148/74, heart rate 56, respiratory rate 20, and O2 sat 94% on room air.  GENERAL  APPEARANCE:  In general this is a well-developed, well-nourished  male in no acute respiratory distress who is pleasant and appropriate.  HEENT:  Normocephalic and atraumatic.  Pupils equal, round and react to  light and accommodation.  Extraocular muscles are intact bilaterally.  Oral  cavity and oropharynx clear.  NECK:  No JVD.  No lymphadenopathy.  No thyromegaly.  LUNGS:  The lungs are clear to auscultation bilaterally without rales or  rhonchi.  HEART:  Cardiovascular - distant.  Regular rate and rhythm.  No murmur,  gallop or rub appreciable.  ABDOMEN:  The abdomen is mildly obese and soft.  Bowel sounds are present.  No hepatosplenomegaly.  No rebound.  No ascites.  EXTREMITIES:  No clubbing, cyanosis or edema in the bilateral lower  extremities.   IMPRESSION AND RECOMMENDATIONS:  1.  Coronary artery disease.  It is unclear to me why the patient is not on a beta blocker.  He is  followed routinely by Dr. Louis Matte in [facility], however.   I will not initiate a beta blocker as I must assumed that his cardiologist  has a good reason to not have him on one at this time.  The immediate  perioperative period is not the time to discover that the patient has had a  reaction to a beta blocker in the past.  Aspirin is being held in the  perioperative period and will be resumed as soon postoperatively as  possible.  There is no evidence of acute change on electrocardiogram and the  patient has no symptoms whatsoever to suggest unstable angina or recurrent  coronary artery disease.   1.  Diabetes mellitus.  The patient reports very good control of his blood sugars at home on  Glucotrol and Glucophage.   Glucophage is being appropriately held because of the myelogram.  We will  resume this in the postoperative period once the patient is stabilized and  we are sure that he will require no further contrast.  In the meantime we will continue Glucotrol and dose the patient with sliding  scale insulin on a  p.r.n. basis.   1.  Hypertension.  Th blood pressure is reasonably controlled at this time with a likely mild  elevation  due to anxiety related to surgery.   We will continue clonidine and Cardura.  I will hold HCTZ to avoid prerenal  azotemia in the perioperative period.   1.  Hyperlipidemia.  The patient is not on a statin at this time.  Again, I would assume there is  a good reason for this as the patient is followed routinely by a  cardiologist in [facility].   I will ot initiate this medication at this time, but will investigate this  further during his hospital stay.   1.  Normocytic anemia.  It is appreciated that the patient has a hemoglobin of 12 with mean  corpuscular volume of 81.  The exact etiology of this is not clear.  There  is no [name] of melena or hematochezia.   I will routinely check stool guaiacs and iron studies while the patient is  hospitalized, and investigate further as appropriate.   1.  Mild chronic renal insufficiency.  The patient has a baseline creatinine of 1.3.   Considering this is a very large man this likely represents norma renal  function for this gentleman.  In the perioperative, however, I will hold  HCTZ to prevent possible insult to the kidneys with the potential for  hypotension associated with anesthesia in the perioperative period.   Thank you very much for your consultation on this very pleasant gentleman.  I will be happy to follow along with you during his hospital stay.      JTM/MEDQ  D:  05/06/2004  T:  05/06/2004  Job:  [id number]

## 2010-05-29 NOTE — Cardiovascular Report (Signed)
NAME:  Benjamin [name], Benjamin [name] NO.:  [id number]   MEDICAL RECORD NO.:  [id number]          PATIENT TYPE:  INP   LOCATION:  2016                         FACILITY:  [facility]   PHYSICIAN:  Charlies Constable, M.D. [facility] DATE OF BIRTH:  Feb 27, 1933   DATE OF PROCEDURE:  12/14/2004  DATE OF DISCHARGE:                              CARDIAC CATHETERIZATION   PROCEDURE:  Cardiac catheterization and percutaneous coronary intervention.   INDICATIONS FOR PROCEDURE:  Mr. Caiazzo is 77 years old and had bypass  surgery in 2004.  He was admitted to [facility] with chest pain and  had positive enzymes consistent with a non-ST elevation infarction.  His EKG  did not show any acute changes.  He was transferred here and scheduled for  evaluation with angiography.  He was treated with Integrilin and heparin.   DESCRIPTION OF PROCEDURE:  The patient was performed by the right femoral  artery using an arterial sheath and 6 French preformed coronary catheters.  A front wall arterial punch was performed, and Omnipaque contrast was used.  After completion of the diagnostic study, we made the decision to proceed  with intervention on the lesion in the native circumflex artery through a  vein supplied by a vein graft.   The patient was given a weighted dose of heparin following an ACT of greater  than 200 seconds, and Integrilin was continued.  Plavix 600 mg and 20 mg of  Pepcid was given at the end of the procedure.  We used an AL1 6 [place]  guiding catheter with side holes and a Prowater wire.  We crossed the lesion  with the wire without difficulty.  We predilated with a 2.25 x 12 Maverick,  performing one inflation up to 8 atmospheres of 30 seconds.  We then  deployed a 2.5 x 16-mm Taxus stent, deploying this with two inflations up to  10 atmospheres for 30 seconds.  We then postdilated with a 2.75 x 12-mm  Quantum Maverick, performing three inflations up to 16 atmospheres for 30  seconds.  Final  diagnostic study was then performed through the guiding  catheter.  The patient tolerated the procedure well and left the laboratory  in satisfactory condition.   RESULTS:  The left main coronary artery was free of significant disease.   The left anterior descending artery gave rise to three diagonal branches and  a septal perforator, and then there was competing flow distally.  There was  80% narrowing in the proximal LAD.  There was 80% narrowing at the ostium of  the first diagonal branch which was grafted.   The circumflex artery gave rise to a marginal branch which was completely  occluded at its origin and a small posterolateral branch.   The right coronary artery gave rise to a conus branch and a right  ventricular branch and was completely occluded in its distal portion.  There  were some faint collaterals from the right coronary artery.  This appeared  to be a small vessel.   The saphenous vein graft to the diagonal branch of the LAD was patent and  functioned normally.   The LIMA graft to the LAD was patent and functioned normally.   The vein graft to the marginal branch of the circumflex artery was patent  and functioned normally, but there was a 90% stenosis just distal to the  graft insertion site.  In the LAO views, this appeared to have a lucency,  and in some of the RAO views appeared to have irregularity suggestive of  ruptured plaque.   The left ventriculogram performed in the RAO projection showed hypokinesis  of the inferobasal wall.  The overall wall motion was good, with an  estimated ejection fraction of 55%.   Following stenting of the lesion in the marginal branch of the circumflex  artery, the stenosis improved from 90% to 0%.   The aortic pressure was 124/54, with a mean of 80 and left ventricular  pressure was 124/7.   CONCLUSION:  1.  Coronary artery disease status post prior coronary artery bypass graft      surgery in 2004.  2.  Severe native  vessel disease with 80% narrowing in the proximal left      anterior descending artery and 80% narrowing in the first diagonal      branch, total occlusion of the marginal branch of the circumflex artery,      and 90% proximal and total occlusion of the distal right coronary      artery.  3.  Patent vein graft to the diagonal branch of the left anterior      descending, patent left internal mammary artery graft to the left      anterior descending, patent vein graft to the marginal branch of the      circumflex artery, with 90% stenosis distal to the insertion site.  4.  Inferobasal wall hypokinesis with an estimated ejection fraction of 55%.  5.  Successful stenting of the lesion in the marginal branch of the      circumflex artery through a vein graft using a Taxus drug-eluting stent,      with improvement in the narrowing from 90% to 0%.   DISPOSITION:  The patient was returned to the holding area for further  observation.           ______________________________  Charlies Constable, M.D. [facility]     BB/MEDQ  D:  12/14/2004  T:  12/15/2004  Job:  [id number]   cc:   Wyvonnia Lora  Fax: [phone]   Macarthur Critchley. Shelva Majestic, M.D.  Fax: [phone]

## 2010-05-29 NOTE — H&P (Signed)
NAME:  Benjamin [name], Benjamin [name] NO.:  [id number]   MEDICAL RECORD NO.:  [id number]                   PATIENT TYPE:  INP   LOCATION:  3172                                 FACILITY:  [facility]   PHYSICIAN:  Stefani Dama, M.D.               DATE OF BIRTH:  [birth year]   DATE OF ADMISSION:  10/12/2001  DATE OF DISCHARGE:                                HISTORY & PHYSICAL   ADMISSION DIAGNOSES:  1. Cervical spondylosis with myelopathy C5-6 and C6-7.  2. Cervical radiculopathy.   HISTORY OF PRESENT ILLNESS:  The patient is a 77 year old individual who was  seen in my office and evaluated for difficulties with numbness in his arms,  neck, shoulder and arm pain in addition to back pain. In fact, he notes that  his back and leg pain has been the more prominent symptoms.  Nonetheless in  his workup, he was found to have a severe stenosis at the L3-L4 level and  was advised regarding surgical decompression there.  In the meantime we  ordered an MRI which demonstrated the patient had severe spondylitic  stenosis at C5-6 and C6-7 with cord compression and nerve root compromise,  particularly on the left side at C5-6 and on the right side at C6-7.  He was  advised that the cervical problem should take precedence,and he should  undergo surgical decompression and stabilization.   PAST MEDICAL HISTORY:  The patient has significant history of diabetes and  has some coronary artery disease.  He is followed by Dr. Harland Dingwall,  He  has been maintained on atenolol, Cardura, Glucophage, and Glucotrol XL.  He  uses some supplemental vitamins.  He underwent surgical decompression and  stabilization of his lumbar spine at L3-4 and L4-5 for spondylitic stenosis  in the past. He is currently retired.   SOCIAL HISTORY:  Otherwise unremarkable.   REVIEW OF SYSTEMS:  Notable for back and lower extremity pain on a 14-point  review sheet.   PHYSICAL EXAMINATION:  GENERAL:  Alert,  oriented, and cooperative individual  in no overt distress.   NEUROLOGIC:  He stands straight and erect with some difficulty.  He is able  to flex forward 60 degrees.  He extends 10  degrees.  Motor strength in the  lower extremities reveals some mild weakness in the tibialis anterior and  quadriceps on the right side.  Iliopsoas strength is also slightly decreased  to 4/5.  Upper extremity strength reveals the deltoids, biceps, triceps,  grips, and intrinsics have good strength to confrontational testing.  Deep  tendon reflexes reveal no biceps reflex on the left side, no triceps reflex  on the right side.  Patellar reflexes are absent on the left, 1+ on the  right.  Achilles reflexes are trace bilaterally.  Babinski's upgoing  bilaterally in the toes.  Sensation diminished on the left lower extremity  but is intact  in both upper extremities.  Cranial nerve examination reveals  pupils are 4 mm, briskly reactive to light and accommodation.  Extraocular  movements are full.  Face symmetric to grimace.  Tongue and uvula are in the  midline.  HEENT:  Sclerae and conjunctivae are clear.  NECK:  No masses, and no bruits are heard.  LUNGS:  Clear to auscultation.  HEART:  Regular rate and rhythm.  No murmurs are soft.  ABDOMEN:  Soft.  Bowel sounds positive.  No masses are palpable.  EXTREMITIES:  No cyanosis, clubbing, or edema.   IMPRESSION:  The patient has evidence of spondylitic myelopathy at C5-6 and  C6-7.  He is now being admitted to undergo surgical decompression.                                               Stefani Dama, M.D.    Merla Riches  D:  10/12/2001  T:  10/16/2001  Job:  [id number]

## 2010-05-29 NOTE — Discharge Summary (Signed)
NAME:  Benjamin [name], LANK NO.:  [id number]   MEDICAL RECORD NO.:  [id number]          PATIENT TYPE:  INP   LOCATION:  3033                         FACILITY:  [facility]   PHYSICIAN:  Stefani Dama, M.D.  DATE OF BIRTH:  January 05, 1934   DATE OF ADMISSION:  05/06/2004  DATE OF DISCHARGE:  05/15/2004                                 DISCHARGE SUMMARY   ADMISSION DIAGNOSES:  1.  Lumbar spondylosis and stenosis with lumbar radiculopathy from L2 to L5.  2.  Coronary artery disease.  3.  Diabetes mellitus.  4.  Hypertension.  5.  Hyperlipidemia.  6.  Normocytic anemia.  7.  Chronic renal insufficiency.   DISCHARGE DIAGNOSES:  1.  Lumbar spondylosis and stenosis with lumbar radiculopathy from L2 to L5.  2.  Coronary artery disease.  3.  Diabetes mellitus.  4.  Hypertension.  5.  Hyperlipidemia.  6.  Normocytic anemia.  7.  Chronic renal insufficiency.  8.  Postoperative constipation.   CONDITION ON DISCHARGE:  Improving.   HOSPITAL COURSE:  Mr. [name] is a 77 year old individual who has had a  previous history of lumbar spondylitic disease.  He underwent bilateral  laminotomies and decompression at L3-4 and L4-5 several years ago; however,  subsequently the patient developed recurrent pain.  He was found to have  developed degenerative spondylolisthesis at the L2-3 level and significant  stenosis at L3-4 and recurrent stenosis at L4-5.  He underwent a myelogram  on the day of admission, which demonstrated that he had severe stenosis with  the worst level being at L3-4.  He also had some mobile segment with some  mobile spondylolisthesis at the L2-3 level.  He was taken to the operating  room on May 07, 2004, where he underwent decompression from L2 to L5 with  posterior lumbar interbody grafting and pedicle screw fixation.  He  tolerated the procedure well.  Blood loss during the procedure was 2200 mL.  He had some postoperative anemia that required transfusion,  and his medical  issues were dealt with by Lonia Blood, M.D., who adjusted his  antihypertensive medication and diuretics.  Gradually after a four-day stay  in the intensive care unit, the patient showed signs of improvement and  cardiac stability.  He was mobilized to the floor.  He had some problems  with postoperative constipation and distention of his abdomen.  This was  gradually overcome.  The patient at the time of discharge was ambulating  with the use of a walker, his incision was  clean and dry, his diabetes was under good control, and he was given a  prescription for Percocet as needed for pain, Valium as a mild muscle  relaxer.  He will be seen in the office in three weeks' time for further  follow-up.   CONDITION ON DISCHARGE:  Improving.       HJE/MEDQ  D:  07/16/2004  T:  07/16/2004  Job:  [id number]

## 2010-05-29 NOTE — H&P (Signed)
NAME:  Benjamin [name], FOWLE NO.:  [id number]   MEDICAL RECORD NO.:  [id number]          PATIENT TYPE:  INP   LOCATION:  1823                         FACILITY:  [facility]   PHYSICIAN:  Doylene Canning. Ladona Ridgel, M.D.  DATE OF BIRTH:  [birth year]   DATE OF ADMISSION:  12/12/2004  DATE OF DISCHARGE:                                HISTORY & PHYSICAL   ADMISSION DIAGNOSIS:  Non-Q-wave myocardial infarction.   HISTORY OF PRESENT ILLNESS:  The patient is a very pleasant 77 year old male  with a history of known coronary artery disease, status post bypass surgery  in 2004.  He has a prior inferior myocardial infarction.  The patient has  long-standing diabetes and diabetes, each have been present for nearly 10  years.  He also has long-standing obesity.  Apparently, he had postoperative  atrial fibrillation at the time of his bypass surgery.  The patient has been  doing fairly well until the last several days.  He has been undergoing  evaluation for abdominal pain, which apparently has been negative.  We do  not have the results from his [facility] physicians regarding this.  Today, his  lower abdominal pain changed, and he actually developed mid epigastric and  left-sided chest pain associated with mild dyspnea.  Serial cardiac enzymes  in [place] demonstrated a CK-MB that was elevated at 49 and 24, respectively.  The troponin was 0.19.  He is admitted for additional evaluation.  Of note,  the patient was anemic with a hemoglobin of 12.  The rest of his labs are  within normal limits.  The patient was treated with intravenous  nitroglycerin, heparin, Integrilin, and was noted to be beta blocked down  with a heart rate of 55 beats per minute.  He is admitted for additional  evaluation.  Of note, his EKG demonstrates sinus rhythm with old inferior MI  and incomplete right bundle branch block.  There were no acute ST segment  changes consistent with ischemia.   PAST MEDICAL HISTORY:  1.  Diabetes for  10 years.  2.  Hypertension for 10 years.  3.  Long-standing obesity.  4.  History of coronary disease as previously noted with prior stenting and      bypass surgery in 2004.   FAMILY HISTORY:  Notable for father dying of heart disease, and mother who  is still living at age 81, but who has coronary disease.  He has a brother  with coronary disease.   SOCIAL HISTORY:  He is married.  He denies tobacco and ethanol use.   REVIEW OF SYSTEMS:  Negative for vision or hearing problems.  He denies  nausea, vomiting, diarrhea, or constipation.  As previously noted in the  HPI.  He denies polyuria, polydipsia, heat or cold intolerance, recent  weight changes, skin problems, arthritic complaints, or neurologic problems.  He denies easy bruisability or other hematologic problems.  The rest of the  review of systems was negative.   PHYSICAL EXAMINATION:  GENERAL:  He is a pleasant, well-appearing, obese,  middle-aged man in no acute distress.  VITAL SIGNS:  The initial blood pressure was 180/100, most recent blood  pressure is 135/70, pulse was 55 and regular, respirations were 18,  temperature is 98.  HEENT:  Normocephalic, atraumatic.  Pupils equal and round.  The oropharynx  is moist.  His sclerae are anicteric.  NECK:  Without jugular venous distention, there is no thyromegaly, the  trachea is midline.  Carotid's are  2+ and symmetric.  CARDIOVASCULAR:  Regular rate and rhythm with normal S1 and S2.  There are  no murmurs, rubs, or gallops.  The PMI is not laterally displaced.  LUNGS:  Clear to auscultation bilaterally.  There are no wheezes, rhonchi,  or rales.  ABDOMEN:  Soft, nontender, nondistended.  There was no organomegaly present.  EXTREMITIES:  No cyanosis, clubbing, or edema.  The pulses were 2+ and  symmetric.   LABORATORY DATA:  EKG demonstrates sinus bradycardia with old inferior MI  and incomplete right bundle branch block.   IMPRESSION:  1.  Ischemic heart disease,  status post myocardial infarction, status post      stenting in the past with non-Q-wave myocardial infarction and atypical      chest pain.  2.  Diabetes.  3.  Hypertension.  4.  Dyslipidemia.  5.  Obesity.   DISCUSSION:  The patient will be admitted to the hospital and have serial  cardiac enzymes and EKG's obtained.  We will plan to continue aspirin,  heparin, Integrilin, beta blockers, and nitrates.  We will plan left heart  catheterization on Monday.  We will check his lipids and blood sugar  control.           ______________________________  Doylene Canning. Ladona Ridgel, M.D.     GWT/MEDQ  D:  12/12/2004  T:  12/12/2004  Job:  [id number]   cc:   Macarthur Critchley. Shelva Majestic, M.D.  Fax: [phone]   Wyvonnia Lora  Fax: [phone]

## 2010-05-29 NOTE — Discharge Summary (Signed)
NAME:  Benjamin [name], Benjamin [name] NO.:  [id number]   MEDICAL RECORD NO.:  [id number]                   PATIENT TYPE:  INP   LOCATION:  2022                                 FACILITY:  [facility]   PHYSICIAN:  Salvatore Decent. Dorris Fetch, M.D.         DATE OF BIRTH:  1933-01-14   DATE OF ADMISSION:  09/20/2002  DATE OF DISCHARGE:  09/30/2002                                 DISCHARGE SUMMARY   ADMISSION DIAGNOSIS:  Chest pain.   ADDITIONAL/DISCHARGE DIAGNOSES:  1. Severe 3-vessel coronary artery disease.  2. Unstable angina.  3. Hypertension.  4. Hyperlipidemia.  5. Type 2 non insulin dependent diabetes mellitus.   PROCEDURES PERFORMED:  1. Cardiac catheterization.  2. Coronary artery bypass grafting x3, (left internal mammary artery to the     LAD, saphenous vein graft to the diagonal, saphenous vein graft to the     OM).  3. Endoscopic vein harvest left thigh.   HISTORY OF PRESENT ILLNESS:  The patient is a 77 year old white male with a  known  history of coronary artery disease. He has undergone  2 previous  stent placements, one to the circumflex in February 1998 and one to the LAD  in April 1999 by Dr. Juanda Chance. He  has been followed by Dr. Shelva Majestic. He  presented to the emergency room at [facility] on September 18, 2002,  complaining of left shoulder and epigastric discomfort. A 12-lead EKG showed  sinus bradycardia with prolonged PR interval and inferolateral T-wave  flattening. He was  treated with morphine, nitroglycerin and oxygen and his  pain resolved. Because of his  previous  history of coronary artery disease  and his symptoms suspicious for unstable angina, he was transferred to [facility] for cardiac catheterization.   HOSPITAL COURSE:  He was admitted on September 20, 2002, and underwent  cardiac catheterization on September 21, 2002. He was found to have severe 3-  vessel coronary artery disease which was not felt to be amenable to  percutaneous intervention. A cardiovascular surgery consultation was  obtained and he was felt to be a good candidate for surgical  revascularization.   He was taken to the operating room on September 24, 2002, and underwent  CABG x3 by Dr. Dorris Fetch as described in detail above. He tolerated  the  procedure well and was transferred to the SICU in stable condition. He was  extubated shortly after surgery and was hemodynamically stable and doing  well on postoperative day #1.   He was bradycardic postoperatively, requiring external atrial pacing. His  beta blocker was held for this reason as well. He was doing well  postoperative day #1 and was able to be transferred to the floor. He was  restarted on his home diabetes medications and his sugars have remained  stable since that time.   He was slowly weaned off the pacer and his heart rate has remained stable in  the 60s. His blood pressure became somewhat elevated and he was started on  an ACE inhibitor. He has been ambulating in the halls without difficulty. He  has been weaned off supplemental oxygen and is maintaining oxygen  saturations of greater than 90% on room air. He  has been maintaining sinus  rhythm, has been  afebrile  and vital signs have been stable. His  surgical  incision sites are healing well. It is felt that if  he continues to remain  stable, he will be ready for discharge home on September 30, 2002.   DISCHARGE MEDICATIONS:  1. Enteric coated aspirin 325 mg every day.  2. Altace 2.5 mg every day.  3. Cardura 4 mg every day.  4. Glucotrol 10 mg b.i.d.  5. Glucophage 500 mg t.i.d.  6. Tylox 1 to  2 tablets q.4h. p.r.n. pain.   DISCHARGE INSTRUCTIONS:  He is to refrain from driving, heavy lifting or  strenuous activity. He may continue daily walking and use an incentive  spirometer. He is asked to continue a low fat, low sodium, diabetic diet. He  may shower daily and clean his incisions with soap and  water.   FOLLOW UP:  He will see Dr. Shelva Majestic back in the office in 2 weeks. Our  office will call and schedule and appointment for him to see Dr. Dorris Fetch  in 3 weeks. He will have a chest x-ray 1 hour prior to this appointment and  will bring his films for Dr. Dorris Fetch to review. He is asked to call our  office in the interim if he experiences any problems or has any questions.      Coral Ceo, P.A.                        Salvatore Decent Dorris Fetch, M.D.    GC/MEDQ  D:  09/29/2002  T:  09/30/2002  Job:  [id number]   cc:   Macarthur Critchley. Shelva Majestic, M.D.  [address] [place]  [address]  [place]  [place] [address]  Fax: [phone]   Wyvonnia Lora  [address]  [place]  [place] [address]  Fax: [phone]   Jonelle Sidle, M.D. [facility]

## 2010-05-29 NOTE — Op Note (Signed)
[place]  Patient:    Benjamin [name], Benjamin [name]                         MRN: [id number] Proc. Date: 04/26/00 Adm. Date:  [id number] Disc. Date: [id number] Attending:  Jonne Ply                           Operative Report  PREOPERATIVE DIAGNOSIS:  L3-4 herniated nucleus pulposus on the right with right lumbar radiculopathy.  POSTOPERATIVE DIAGNOSIS:  L3-4 herniated nucleus pulposus on the right with right lumbar radiculopathy.  OPERATION PERFORMED:  Lumbar microendoscopic diskectomy with Met-Rx system. Operating microscope and microdissection technique.  SURGEON:  Stefani Dama, M.D.  ASSISTANT:  Tanya Nones. Jeral Fruit, M.D.  ANESTHESIA:  General endotracheal.  INDICATIONS FOR PROCEDURE:  The patient is a 77 year old individual who has had significant back and right lower extremity pain for a weeks time.  He has weakness in the quadriceps.  He has a large extruded fragment of disk from L3-4.  DESCRIPTION OF PROCEDURE:  The patient was brought to the operating room supine on the stretcher.  After smooth induction of general endotracheal anesthesia he was turned prone.  The back was shaved and prepped with DuraPrep and draped in a sterile fashion.  Using fluoroscopy the L3-4 space was localized first in the AP plane and then the lateral plane.  A stab incision was made in the right paraspinous region at the region of L3-4 and then a K-wire was passed through the laminar arch of L4 at the level of the facet joint as a previous laminectomy had been performed in this region.  The dissection was then carried out through this K-wire and a series of dilators was placed over to the 18 mm diameter and an 18 mm x 6 cm deep cannula was placed over the interspace of the L3-4 space on the right side.  The cannula was fixed to the table clamp and then soft tissue from this area was resected. The site was at the facet laminar border at the L3-4 space.  This was  then rongeured smooth and a ____________ drill with a 2.3 mm dissecting tool was used to thin the lateral margin of the facet joint at L3-4.  The common dural tube was then identified and some dense adherent fascia was stripped from this to identify the lateral margin of the common dural tube and then following the border of the common dural tube along the facet, the disk space was identified by first entering through some fragments of disk material that were removed. Further dissection allowed freedom of the common dural tube and the L4 nerve root could be retracted medially.  Underlying this was found several other fragments of disk which when decompressed allowed for brisk venous bleeding. The bleeding was tamponaded with some Gelfoam soaked in thrombin and then using a slow and tedious process of tamponade following by some dissection and resection of disk and then tamponade again, the inferior most aspect of the laminotomy site and take off of the L4 nerve root from the superior most aspect of it in the region of the L3-4 disk space was dissected free using a microdissection technique and the operating microscope.  The dissection yielded significant quantities of disk material and when the disk space was entered, several other quantities of markedly degenerated disk material were resected from within it.  This allowed for  good decompression of the common dural tube and the L4 nerve root.  Nonetheless during this dissection, approximately 350 to 400 cc of blood loss was encountered.  Hemostasis was achieved then after packing with some Gelfoam soaked in thrombin which was later removed and once adequate hemostasis in this area was achieved and the common dural tube and the L4 nerve root was well decompressed.  The endoscope was removed and then the fascia was closed with 3-0 Vicryl in interrupted fashion and 3-0 Vicryl was used in the subcuticular tissues to close the skin. Dermabond  was used on the skin.  The patient tolerated the procedure well and was returned to the recovery room in stable condition. DD:  04/26/00 TD:  04/27/00 Job: [id number]

## 2010-05-29 NOTE — Op Note (Signed)
NAME:  Benjamin [name], Benjamin [name] NO.:  [id number]   MEDICAL RECORD NO.:  [id number]          PATIENT TYPE:  INP   LOCATION:  3104                         FACILITY:  [facility]   PHYSICIAN:  Stefani Dama, M.D.  DATE OF BIRTH:  [birth year]   DATE OF PROCEDURE:  05/07/2004  DATE OF DISCHARGE:                                 OPERATIVE REPORT   PREOPERATIVE DIAGNOSIS:  Lumbar spondylosis, stenosis, and lumbar  radiculopathy.   POSTOPERATIVE DIAGNOSIS:  Lumbar spondylosis, stenosis, and lumbar  radiculopathy.   PROCEDURE:  Lumbar decompression, L3-L4, secondary to herniated nucleus  pulposus and, stenosis; spondylosis, L4-L5, with decompression at this level  also; segmental fixation L2-L5 with pedicle screws; posterior interbody  arthrodesis, L3-L4, with bone spacer; posterolateral arthrodesis, L2 to L5,  with iliac crest bone graft and INFUSE bone morphogenic protein product,  allograft also.   SURGEON:  Stefani Dama, M.D.   FIRST ASSISTANT:  Danae Orleans. Venetia Maxon, M.D.   ANESTHESIA:  General endotracheal.   INDICATIONS:  Mr. Donald is a 77 year old individual who has had significant  back and bilateral leg pain with progressive weakness.  He has developed  some atrophy in his left lower extremity.  A myelogram was performed  yesterday and this demonstrated a high-grade stenosis at the L3-L4 level  where he has had retrolisthesis of the vertebra and significant degenerative  changes.  After careful consideration of his options, he was advised  regarding surgical decompression with arthrodesis from L2 to L5.   PROCEDURE:  The patient was brought to the operating room supine on a  stretcher.  After the smooth induction of general endotracheal anesthesia,  he had the appropriate monitoring lines including an arterial line  and a  Foley catheter placed.  He was then carefully turned prone onto the  operating table with parallel vertical rolls.  The patient had his back  shaved,  prepped with DuraPrep and draped in a sterile fashion.  The  previously made midline incision was reopened and the incision was taken  down to the lumbodorsal fascia, which was opened on either side of the  midline to expose the spinous processes superiorly of L2 and inferiorly of  L5.  The dissection was then taken out laterally over the facet joints and  the dissection was carried out over the intertransverse spaces from L2 to L5  to expose these areas.  These areas were packed off and then the lateral  borders of the laminectomy were identified at each of the levels.  A self-  retaining retractor was placed in the wound; this was a Office manager.  The dura was identified carefully and dissected of a significant buildup of  severe amounts of scar tissue, particularly around the level of L3-L4.  The  patient then underwent decompression at the L3-L4 level and there was noted  to be a fractured facet on the left side of the L3 process and this was  elevated and removed.  The lateral portion of the dura was then decompressed  and there was noted be a substantial disk herniation that existed on  both  sides at the L3-L4 level.  By mobilizing the common dural tube and stripping  off the epidural fibrosis from this region, the L4 nerve root could be  identified inferiorly and this was dissected free.  The L3 nerve root  superiorly was identified and this was also dissected of a significant  quantity of severely degenerated and chronically herniated disk material.  The disk space was then entered.  The procedure was done bilaterally, first  starting on the right and then moving to the left, and gradually as more and  more disk was removed, the disk space was mobilized; a disk space spreader  could be placed into the wound then.  Dissection was then carried down to  the L4 nerve root and the L4-5 space was also decompressed and the lateral  recesses.  The disk here was not herniated; however,  there was significant  facetal overgrowth, particularly with the superior articular process from L5  causing some stenosis laterally near the extraforaminal space; this was  decompressed first on the left and then also on the right side.  Once an  adequate decompression was obtained, the endplates of the bone were  decorticated maximally, both medially and laterally and superiorly and  inferiorly.  The dissection was then carried out to open up the interspace  considerably and allow for placement of interbody spacers.  These were  polyethylethylketone spacers that were 10 mm in height.  The bone that had  been harvested from the laminotomies and laminectomies was used to pack the  interspace along with some  INFUSE supplementation which was placed into the  interspace.  The interspace was then packed full of bone.  Attention was  then turned to placing the interbody spacers and this was done with care.  Then using fluoroscopic guidance, the pedicle entry sites were chosen at L2,  L3, L4 and L5, first on the right side.  Probes were placed and then each of  the pedicles was instrumented with a 6.5 x 50-mm pedicle screw from L2 to L5  on the right side and L5 to L2 on the left side.  Once the pedicle screws  were placed, positioning was checked in AP and lateral projection.  Each of  nerve roots was checked that there was no cutout and no cutout could be  identified and then the fluoroscopic unit was removed.  The lateral recesses  which had previously been uncovered were decorticated using a high-speed bur  and 4-mm round dissector. Bone graft was then harvested from the left  posterosuperior iliac crest by using a separate fascial incision to open up  the superior gluteal fascia over the crest and then strip the gluteus muscle  in subperiosteal fashion to allow first harvesting of corticocancellous strips and cancellus bone from underneath this until an adequate sample bone  was obtained  from the left posterosuperior iliac crest.  All the time,  hemostasis was maintained carefully.  An additional aliquot of blood from  the bleeding bone was harvested to mix with 20-mL aliquot of VITOSS granules  Once the bone and blood were harvested, then the fascia was closed carefully  with #1 Vicryl in interrupted fashion in the fascia  in 2 layers.  Attention  was then turned to placing the bone graft in the posterolateral gutters  after the area was decorticated and then INFUSE strips were placed over the  bone with additional bone being placed on top of that; this was done in both  lateral gutters.  Then ultimately the pedicle screws were connected with a  110-mm rod that was contoured carefully to fit in the construct and the  singular cross-link was also fitted.  The system was torqued down into the  proper specifications and then the wound was carefully inspected.  A small  dural rent which occurred at the level of the L3-L4 space where a previous  rent was noted was over-sewn with a single figure-of-eight suture and no  further leakage was identified.  The nerve roots were each checked and were  noted to be well-decompressed from L2 down to the L5 nerve roots, the common  dural tube was well-decompressed, the area was copiously irrigated with  antibiotic irrigating solution, hemostasis in the soft tissues was  meticulously obtained, then lumbodorsal fascia was closed with #1 Vicryl  interrupted fashion, 2-0 Vicryl was used in the subcutaneous tissues and 3-0  Vicryl subcuticularly.  Dermabond was placed on the skin.  Blood loss for  the procedure was estimated 2000 mL and about 400 mL of Cell Saver blood was  returned.      HJE/MEDQ  D:  05/07/2004  T:  05/08/2004  Job:  [id number]

## 2010-06-22 ENCOUNTER — Encounter: Payer: Self-pay | Admitting: Cardiovascular Disease

## 2010-06-22 ENCOUNTER — Encounter: Payer: Self-pay | Admitting: Cardiology

## 2010-06-23 ENCOUNTER — Ambulatory Visit (INDEPENDENT_AMBULATORY_CARE_PROVIDER_SITE_OTHER): Payer: PRIVATE HEALTH INSURANCE | Admitting: Cardiovascular Disease

## 2010-06-23 ENCOUNTER — Encounter: Payer: Self-pay | Admitting: Cardiovascular Disease

## 2010-06-23 VITALS — BP 178/73 | HR 46 | Resp 14 | Ht 73.0 in | Wt 267.0 lb

## 2010-06-23 DIAGNOSIS — L98499 Non-pressure chronic ulcer of skin of other sites with unspecified severity: Secondary | ICD-10-CM

## 2010-06-23 NOTE — Progress Notes (Signed)
HPI:  This is a 77 year old gentleman presenting for initial evaluation of lower extremity peripheral arterial disease. The patient has developed callus along his left heel and underwent debridement. He has subsequently developed a nonhealing ulcer. He has been treated with topical therapies and wound care with slow improvement. The patient is also on oral antibiotics. He does describe pain in the left heel region, but denies fever, chills, redness of the leg, or discharge from the wound. He has been recently followed by Dr. Ulice Brilliant, who noted the patient had an abnormal pulse exam and referred him for lower extremity ankle brachial indices. This demonstrated a right ABI is falsely elevated at 0.95 and the left is 0.40 with Doppler analysis demonstrating monophasic waveforms in the ankle bilaterally. The findings of this study are in comparison to a similar study from 2009 when the patient had a right ABI of 0.94 left ABI 1.04. At that time it was noted that the ABIs were probably falsely elevated..  The patient does not have typical symptoms of claudication. He is able to walk on level ground or tender 15 minutes. He denies calf pain with ambulation. He describes some pain in his foot, worse when his legs are propped up. He does not have a history of ulceration until his recent left heel ulcer developed.  His biggest complaint related to his legs is that of gait instability. He has not had frequent falls but he feels unsteady on his feet. He has no history of tobacco use, but has had diabetes for several years. The patient denies chest pain or dyspnea and has no other complaints at present.  Outpatient Encounter Prescriptions as of 06/23/2010  Medication Sig Dispense Refill  . amLODipine (NORVASC) 5 MG tablet TAKE ONE TABLET BY MOUTH DAILY  30 tablet  6  . aspirin 81 MG tablet Take 81 mg by mouth daily.        . carvedilol (COREG) 3.125 MG tablet Take 3.125 mg by mouth 2 (two) times daily.        .  clopidogrel (PLAVIX) 75 MG tablet Take 75 mg by mouth daily.        Marland Kitchen doxazosin (CARDURA) 4 MG tablet Take 4 mg by mouth 2 (two) times daily.        . ferrous sulfate 325 (65 FE) MG tablet Take 1 tablet (325 mg total) by mouth 3 (three) times daily with meals. PLEASE SEND FUTURE REQUEST TO PCP  90 tablet  0  . glipiZIDE (GLUCOTROL) 10 MG 24 hr tablet Take 10 mg by mouth daily.        . hydrochlorothiazide 25 MG tablet Take 25 mg by mouth daily.        . isosorbide mononitrate (IMDUR) 30 MG 24 hr tablet Take 30 mg by mouth daily.        Marland Kitchen L-Methylfolate-B6-B12 (METANX) 3-35-2 MG TABS Take 1 tablet by mouth daily.        . nitroGLYCERIN (NITROSTAT) 0.4 MG SL tablet Place 0.4 mg under the tongue every 5 (five) minutes as needed.        . pantoprazole (PROTONIX) 40 MG tablet Take 40 mg by mouth daily.        . pravastatin (PRAVACHOL) 80 MG tablet TAKE 1 TABLET BY MOUTH EVERY NIGHT AT BEDTIME  30 tablet  6  . metFORMIN (GLUCOPHAGE) 500 MG tablet Take 500 mg by mouth 2 (two) times daily. (on hold)         Penicillins  Past  Medical History  Diagnosis Date  . CAD (coronary artery disease)     Severe native, Statuspost coronary bypass grafting 2004 with a I to the LAD and the sac was vein graft obtuse marginal and a  second vein graft to the diagonal. Preserved LV function with an ejection fraction of 55%  . Upper GI bleeding     history of upper GI bleeding status pst EGD and colonoscopy. Anicoagulation with aspirin and plavix  . Iron deficiency anemia     Secondary to GI bleed improved with current therapy  . Diabetes mellitus   . Chronic renal insufficiency     rather than baseline 1.56  . Hypertension   . Obesity   . DJD (degenerative joint disease)   . Atrial fibrillation     History of postoperative atrial fibrillation  . Internal mammary artery injury     Continued patency of the internal mammary to the LAD, occlusion of the saphenous vein graft to the diagonal, severe multiple  high-grade lesions with large clot burder in saphenous vein graft to the OM with small distal target, and high-grade in-stent restenosis    No past surgical history on file.  History   Social History  . Marital Status: Married    Spouse Name: N/A    Number of Children: N/A  . Years of Education: N/A   Occupational History  . Not on file.   Social History Main Topics  . Smoking status: Never Smoker   . Smokeless tobacco: Not on file  . Alcohol Use: No  . Drug Use: Not on file  . Sexually Active: Not on file   Other Topics Concern  . Not on file   Social History Narrative  . No narrative on file    Family History  Problem Relation Age of Onset  . Heart disease Father   . Coronary artery disease Mother   . Coronary artery disease Brother     ROS: General: no fevers/chills/night sweats Eyes: no blurry vision, diplopia, or amaurosis ENT: no sore throat or hearing loss Resp: no cough, wheezing, or hemoptysis CV: no edema or palpitations GI: no abdominal pain, nausea, vomiting, diarrhea, or constipation GU: no dysuria, frequency, or hematuria Skin: no rash Neuro: no headache, numbness, tingling, or weakness of extremities Musculoskeletal: no joint pain or swelling Heme: no bleeding, DVT, or easy bruising Endo: no polydipsia or polyuria  BP 178/73  Pulse 46  Resp 14  Ht 6\' 1"  (1.854 m)  Wt 267 lb (121.11 kg)  BMI 35.23 kg/m2  PHYSICAL EXAM: Pt is alert and oriented, WD, WN, in no distress. HEENT: normal Neck: JVP normal. Carotid upstrokes normal without bruits. No thyromegaly. Lungs: equal expansion, clear bilaterally CV: Apex is discrete and nondisplaced, RRR without murmur or gallop Abd: soft, NT, +BS, obese, no bruit, no hepatosplenomegaly Back: no CVA tenderness Ext: no C/C/E        Femoral pulses 2+ on the right and 3+ on the left without bruits        DP/PT pulses are absent bilaterally Skin: the left heel has a well-circumscribed 0.5 cm ulcer without  surrounding erythema or warmth Neuro: CNII-XII intact             Strength intact = bilaterally  ASSESSMENT AND PLAN:

## 2010-06-23 NOTE — Assessment & Plan Note (Addendum)
The patient's noninvasive studies suggest severe bilateral lower extremity peripheral arterial disease. He has developed an ulceration on the left foot and has a markedly diminished ABI with blunted waveforms at the ankle. I suspect he primarily has tibial disease but he is at risk for multilevel disease. I think the best way to proceed is with definitive evaluation of his arterial anatomy with invasive angiography. The patient has chronic kidney disease and we can probably limit dye exposure more with invasive angiography then with CT angiography. I also think the patient has heavily calcified vessels and it will be easier to visualize his vasculature with angiography. Risks and indication of invasive angiography with an eye toward PTA and stenting were reviewed with the patient and he agrees to proceed.  The patient's metformin and hydrochlorothiazide will be held prior to angiography.

## 2010-06-23 NOTE — Patient Instructions (Addendum)
Your physician has requested that you have a peripheral vascular angiogram. This exam is performed at the hospital. During this exam IV contrast is used to look at arterial blood flow. Please review the information sheet given for details.  Your physician recommends that you schedule a follow-up appointment in: 1 MONTH

## 2010-06-24 ENCOUNTER — Other Ambulatory Visit: Payer: Self-pay | Admitting: Cardiology

## 2010-06-29 ENCOUNTER — Encounter: Payer: Self-pay | Admitting: *Deleted

## 2010-07-01 ENCOUNTER — Ambulatory Visit (HOSPITAL_COMMUNITY): Payer: PRIVATE HEALTH INSURANCE

## 2010-07-01 ENCOUNTER — Ambulatory Visit (HOSPITAL_COMMUNITY)
Admission: RE | Admit: 2010-07-01 | Discharge: 2010-07-01 | Disposition: A | Discharge status: Home / Self Care | Payer: PRIVATE HEALTH INSURANCE | Source: Ambulatory Visit | Attending: Cardiovascular Disease | Admitting: Cardiovascular Disease

## 2010-07-01 DIAGNOSIS — E785 Hyperlipidemia, unspecified: Secondary | ICD-10-CM | POA: Insufficient documentation

## 2010-07-01 DIAGNOSIS — E119 Type 2 diabetes mellitus without complications: Secondary | ICD-10-CM | POA: Insufficient documentation

## 2010-07-01 DIAGNOSIS — Z7982 Long term (current) use of aspirin: Secondary | ICD-10-CM | POA: Insufficient documentation

## 2010-07-01 DIAGNOSIS — I739 Peripheral vascular disease, unspecified: Secondary | ICD-10-CM

## 2010-07-01 DIAGNOSIS — Z7902 Long term (current) use of antithrombotics/antiplatelets: Secondary | ICD-10-CM | POA: Insufficient documentation

## 2010-07-01 DIAGNOSIS — L97409 Non-pressure chronic ulcer of unspecified heel and midfoot with unspecified severity: Secondary | ICD-10-CM | POA: Insufficient documentation

## 2010-07-01 DIAGNOSIS — I10 Essential (primary) hypertension: Secondary | ICD-10-CM | POA: Insufficient documentation

## 2010-07-01 DIAGNOSIS — I2510 Atherosclerotic heart disease of native coronary artery without angina pectoris: Secondary | ICD-10-CM | POA: Insufficient documentation

## 2010-07-01 DIAGNOSIS — L98499 Non-pressure chronic ulcer of skin of other sites with unspecified severity: Secondary | ICD-10-CM | POA: Insufficient documentation

## 2010-07-01 DIAGNOSIS — D509 Iron deficiency anemia, unspecified: Secondary | ICD-10-CM | POA: Insufficient documentation

## 2010-07-01 DIAGNOSIS — Z79899 Other long term (current) drug therapy: Secondary | ICD-10-CM | POA: Insufficient documentation

## 2010-07-01 LAB — CBC
HCT: 34.4 % — ABNORMAL LOW (ref 39.0–52.0)
Hemoglobin: 12 g/dL — ABNORMAL LOW (ref 13.0–17.0)
RBC: 4.03 MIL/uL — ABNORMAL LOW (ref 4.22–5.81)

## 2010-07-01 LAB — BASIC METABOLIC PANEL
BUN: 35 mg/dL — ABNORMAL HIGH (ref 6–23)
CO2: 24 mEq/L (ref 19–32)
Calcium: 9.2 mg/dL (ref 8.4–10.5)
Creatinine, Ser: 1.54 mg/dL — ABNORMAL HIGH (ref 0.50–1.35)

## 2010-07-01 LAB — GLUCOSE, CAPILLARY
Glucose-Capillary: 173 mg/dL — ABNORMAL HIGH (ref 70–99)
Glucose-Capillary: 146 mg/dL — ABNORMAL HIGH (ref 70–99)

## 2010-07-01 LAB — PROTIME-INR: INR: 1.14 (ref 0.00–1.49)

## 2010-07-07 ENCOUNTER — Telehealth: Payer: Self-pay | Admitting: *Deleted

## 2010-07-07 DIAGNOSIS — I519 Heart disease, unspecified: Secondary | ICD-10-CM

## 2010-07-07 DIAGNOSIS — R0989 Other specified symptoms and signs involving the circulatory and respiratory systems: Secondary | ICD-10-CM

## 2010-07-07 DIAGNOSIS — I2510 Atherosclerotic heart disease of native coronary artery without angina pectoris: Secondary | ICD-10-CM

## 2010-07-07 NOTE — Telephone Encounter (Signed)
Go ahead and order carotid Dopplers. Probably can be done in our office. He may ask Clerance Lav his wife where she wants to have this done. She may prefer the hospital

## 2010-07-07 NOTE — Telephone Encounter (Signed)
Benjamin [name] saw Dr. Excell Seltzer on 06-21 and Dr. Excell Seltzer told patient he heard bruit on left side and suggested That he contact Dr. Andee Lineman to see about ordering test for bruit.

## 2010-07-08 NOTE — Telephone Encounter (Signed)
Left message to return call 

## 2010-07-08 NOTE — Telephone Encounter (Signed)
Wife notified of below.  Would like to schedule for August as he will also be due for Echo then as well.  Would prefer to do at [facility] instead of our office.  Will inform Vicky to schedule.  She verbalized understanding.

## 2010-07-16 ENCOUNTER — Encounter: Payer: Self-pay | Admitting: Cardiovascular Disease

## 2010-07-24 ENCOUNTER — Other Ambulatory Visit: Payer: Self-pay | Admitting: *Deleted

## 2010-07-24 MED ORDER — CLOPIDOGREL BISULFATE 75 MG PO TABS: 75.0000 mg | ORAL_TABLET | Freq: Every day | ORAL | Status: DC

## 2010-07-30 NOTE — Procedures (Signed)
  NAME:  Benjamin [name], Benjamin [name] NO.:  [id number]  MEDICAL RECORD NO.:  [id number]  LOCATION:  [facility]                         FACILITY:  [facility]  PHYSICIAN:  Veverly Fells. Excell Seltzer, MD  DATE OF BIRTH:  [birth year]  DATE OF PROCEDURE:  07/01/2010 DATE OF DISCHARGE:  07/01/2010                   PERIPHERAL VASCULAR INVASIVE PROCEDURE   PROCEDURE:  Left external iliac angiography with runoff to the left foot.  PROCEDURAL INDICATIONS:  [name] is a 77 year old gentleman with coronary artery disease, diabetes, hypertension, and hyperlipidemia.  He has developed a left heel ulcer and underwent ABIs.  His ABI on the left was 0.4 with a suggestion of severe tibial disease.  He was referred for angiography and possible PTA.  Risks and indications of procedure were reviewed with the patient. Informed consent was obtained.  The right groin was prepped, draped, and anesthetized with 1% lidocaine using modified Seldinger technique.  A 5- French sheath was placed in the right femoral artery.  A crossover catheter was used to access the left iliac artery.  A glide wire was advanced into the left femoral artery and straight end-hole catheter was advanced into the left external iliac artery.  A runoff was performed to the left foot using digital subtraction following the bolus chase method.  After the runoff images were done, a lateral view of the foot was performed.  PROCEDURAL FINDINGS:  There is wide patency of the common femoral artery.  The superficial and deep femoral arteries are also patent with diffuse plaquing, but no areas of high-grade stenosis are noted.  The peroneal artery is patent and the anterior tibial is diffusely diseased and then is totally occluded.  There is a long occlusion and the dorsalis pedis artery appears to reconstitute late via collaterals.  The tibioperoneal trunk is occluded and peroneal artery reconstitutes and fills from collaterals.  The posterior  tibial never really reconstitutes.  ASSESSMENT:  Severe tibial occlusive disease.  RECOMMENDATIONS:  The patient does not appear to have any good revascularization options.  I discussed his case with Dr. Adam Phenix. We will refer him for wound care and hyperbaric treatments.     Veverly Fells. Excell Seltzer, MD     MDC/MEDQ  D:  07/01/2010  T:  07/02/2010  Job:  [id number]  cc:   Learta Codding, MD,FACC Denny Peon. Ulice Brilliant, D.P.M.  Electronically Signed by Tonny Bollman MD on 07/30/2010 12:27:12 AM

## 2010-08-03 ENCOUNTER — Encounter: Payer: Self-pay | Admitting: Cardiovascular Disease

## 2010-08-06 ENCOUNTER — Ambulatory Visit: Payer: PRIVATE HEALTH INSURANCE | Admitting: Cardiovascular Disease

## 2010-08-19 DIAGNOSIS — I2510 Atherosclerotic heart disease of native coronary artery without angina pectoris: Secondary | ICD-10-CM

## 2010-08-21 ENCOUNTER — Encounter: Payer: Self-pay | Admitting: Vascular Surgery

## 2010-08-25 ENCOUNTER — Encounter: Payer: Self-pay | Admitting: Vascular Surgery

## 2010-08-25 ENCOUNTER — Ambulatory Visit (INDEPENDENT_AMBULATORY_CARE_PROVIDER_SITE_OTHER): Payer: PRIVATE HEALTH INSURANCE | Admitting: Vascular Surgery

## 2010-08-25 VITALS — BP 184/57 | HR 51 | Resp 20 | Ht 73.0 in | Wt 250.0 lb

## 2010-08-25 DIAGNOSIS — I739 Peripheral vascular disease, unspecified: Secondary | ICD-10-CM

## 2010-08-25 DIAGNOSIS — L98499 Non-pressure chronic ulcer of skin of other sites with unspecified severity: Secondary | ICD-10-CM

## 2010-08-25 NOTE — Progress Notes (Signed)
10pointSubjective:     Patient ID: Benjamin [name], male   DOB: May 04, 1933, 77 y.o.   MRN: [id number]  HPI this 77 year old male patient was referred for evaluation of left heel ulcer. In early June he developed a callus on his left heel. This was debridement on 2 occasions. He was then referred to the wound center. He has been in a special boot and cast to relieve pressure from this area here He was evaluated by Dr. Tonny Bollman who performed an angiogram. This revealed a patent common superficial femoral and profunda femoris artery. The popliteal artery was also patent all tibial vessels were occluded with no potential for revascularization. He was referred to me today for a second opinion because the ulcer has enlarged significantly in size since the boot was placed one week ago. He denies claudication symptoms but does not ambulate e long distances.  Past Medical History  Diagnosis Date  . CAD (coronary artery disease)     Severe native, Statuspost coronary bypass grafting 2004 with a I to the LAD and the sac was vein graft obtuse marginal and a  second vein graft to the diagonal. Preserved LV function with an ejection fraction of 55%  . Upper GI bleeding     history of upper GI bleeding status pst EGD and colonoscopy. Anicoagulation with aspirin and plavix  . Iron deficiency anemia     Secondary to GI bleed improved with current therapy  . Diabetes mellitus   . Chronic renal insufficiency     rather than baseline 1.56  . Hypertension   . Obesity   . DJD (degenerative joint disease)   . Atrial fibrillation     History of postoperative atrial fibrillation  . Internal mammary artery injury     Continued patency of the internal mammary to the LAD, occlusion of the saphenous vein graft to the diagonal, severe multiple high-grade lesions with large clot burder in saphenous vein graft to the OM with small distal target, and high-grade in-stent restenosis    History  Substance Use Topics  .  Smoking status: Never Smoker   . Smokeless tobacco: Not on file  . Alcohol Use: No    Family History  Problem Relation Age of Onset  . Heart disease Father   . Coronary artery disease Mother   . Coronary artery disease Brother     Allergies  Allergen Reactions  . Penicillins     REACTION: rash    Current outpatient prescriptions:amLODipine (NORVASC) 5 MG tablet, TAKE ONE TABLET BY MOUTH DAILY, Disp: 30 tablet, Rfl: 6;  aspirin 81 MG tablet, Take 81 mg by mouth daily.  , Disp: , Rfl: ;  carvedilol (COREG) 3.125 MG tablet, Take 3.125 mg by mouth 2 (two) times daily.  , Disp: , Rfl: ;  clopidogrel (PLAVIX) 75 MG tablet, Take 1 tablet (75 mg total) by mouth daily., Disp: 30 tablet, Rfl: 6 doxazosin (CARDURA) 4 MG tablet, Take 4 mg by mouth 2 (two) times daily.  , Disp: , Rfl: ;  ferrous sulfate 325 (65 FE) MG tablet, Take 1 tablet (325 mg total) by mouth 3 (three) times daily with meals. PLEASE SEND FUTURE REQUEST TO PCP, Disp: 90 tablet, Rfl: 0;  glipiZIDE (GLUCOTROL) 10 MG 24 hr tablet, Take 10 mg by mouth daily.  , Disp: , Rfl: ;  hydrochlorothiazide 25 MG tablet, TAKE 1 TABLET BY MOUTH EVERY DAY, Disp: 30 tablet, Rfl: 5 isosorbide mononitrate (IMDUR) 30 MG 24 hr tablet, TAKE ONE TABLET  BY MOUTH DAILY, Disp: 30 tablet, Rfl: 5;  L-Methylfolate-B6-B12 (METANX) 3-35-2 MG TABS, Take 1 tablet by mouth daily.  , Disp: , Rfl: ;  metFORMIN (GLUCOPHAGE) 500 MG tablet, Take 500 mg by mouth 2 (two) times daily. (on hold) , Disp: , Rfl: ;  nitroGLYCERIN (NITROSTAT) 0.4 MG SL tablet, Place 0.4 mg under the tongue every 5 (five) minutes as needed.  , Disp: , Rfl:  pantoprazole (PROTONIX) 40 MG tablet, Take 40 mg by mouth daily.  , Disp: , Rfl: ;  pravastatin (PRAVACHOL) 80 MG tablet, TAKE 1 TABLET BY MOUTH EVERY NIGHT AT BEDTIME, Disp: 30 tablet, Rfl: 6  Filed Vitals:   08/25/10 1128  Height: 6\' 1"  (1.854 m)  Weight: 250 lb (113.399 kg)    Body mass index is 32.98 kg/(m^2).         Review of  systems negative except for: Review of Systems  Constitutional: Negative for fever and chills.  Respiratory: Negative for chest tightness and shortness of breath.   Skin: Positive for wound.       Objective:   Physical Exam  Constitutional: He is oriented to person, place, and time. He appears well-developed and well-nourished. No distress.  HENT:  Head: Normocephalic.  Mouth/Throat: Oropharynx is clear and moist.  Eyes: EOM are normal.  Neck: Normal range of motion. Neck supple.  Cardiovascular: Normal rate and regular rhythm.   No murmur heard.      He has 3+ femoral pulses palpable bilaterally and 2+ popliteal pulses palpable bilaterally. No pedal pulses are palpable in either lower tremors. The left foot has an ulceration on the plantar surface measuring approximately 2.5 x 2 cm in diameter with a granular base. No bone was exposed. The cellulitis is noted.  Pulmonary/Chest: Effort normal and breath sounds normal. No respiratory distress. He has no wheezes. He has no rales.  Abdominal: Soft. He exhibits no distension and no mass. There is no tenderness. There is no rebound and no guarding.  Musculoskeletal: Normal range of motion. He exhibits no edema.  Lymphadenopathy:    He has no cervical adenopathy.  Neurological: He is alert and oriented to person, place, and time. Coordination normal.  Skin: Skin is warm and dry. No rash noted.  Psychiatric: His behavior is normal.       Assessment:    nonhealing plantar ulcer and diabetic with severe tibial occlusive disease-non-reconstructable    Plan:    agree that patient is not a candidate for revascularization. Will refer back to what wound center for further local wound care. Would consider hyperbaric oxygen treatment to see if this makes a difference sats revascularization is not an option . Would recommend hyperbaric oxygen trial.

## 2010-09-04 ENCOUNTER — Encounter: Payer: Self-pay | Admitting: Cardiology

## 2010-09-04 ENCOUNTER — Ambulatory Visit (INDEPENDENT_AMBULATORY_CARE_PROVIDER_SITE_OTHER): Payer: PRIVATE HEALTH INSURANCE | Admitting: Cardiology

## 2010-09-04 VITALS — BP 155/68 | HR 48 | Ht 73.0 in | Wt 266.4 lb

## 2010-09-04 DIAGNOSIS — D649 Anemia, unspecified: Secondary | ICD-10-CM

## 2010-09-04 DIAGNOSIS — I2510 Atherosclerotic heart disease of native coronary artery without angina pectoris: Secondary | ICD-10-CM

## 2010-09-04 DIAGNOSIS — I712 Thoracic aortic aneurysm, without rupture: Secondary | ICD-10-CM

## 2010-09-04 DIAGNOSIS — Z8719 Personal history of other diseases of the digestive system: Secondary | ICD-10-CM

## 2010-09-04 MED ORDER — FERROUS SULFATE 325 (65 FE) MG PO TABS: 325.0000 mg | ORAL_TABLET | Freq: Three times a day (TID) | ORAL | Status: DC

## 2010-09-04 NOTE — Assessment & Plan Note (Signed)
Pletal is contraindicated in conjunction with aspirin and Lasix in this patient with prior history of GI bleeding. Continue only aspirin and Plavix for now and monitor CBC

## 2010-09-04 NOTE — Assessment & Plan Note (Signed)
Stable with no recurrent chest pain. Continue medical therapy ejection fraction has remained unchanged

## 2010-09-04 NOTE — Progress Notes (Signed)
HPI The patient is a 77 year old male with history of coronary artery disease, status post coronary bypass grafting. He has known significant graft disease as well as native coronary disease but is being treated medically. He had a recent echocardiogram which showed an ejection fraction of 40-45% with multiple segmental wall motion abnormalities but essentially unchanged from prior study. He he was found to have a dilated aorta at 45 mm. He had carotid Dopplers done which showed no significant obstructive carotid artery disease. The patient has a history of renal insufficiency and anemia followed by his primary care physician. Has severe peripheral vascular disease with inoperable disease below both knees and has now developed an ulcer in the left foot. He is managed by the wound clinic. 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 Blood work recently showed that his iron saturation he still low. His hemoglobin also remains low at 11.4. From a cardiovascular standpoint otherwise patient is doing well. He reports no chest pain shortness of breath orthopnea or PND he has no palpitations or syncope.  Allergies  Allergen Reactions  . Penicillins     REACTION: rash    Current Outpatient Prescriptions on File Prior to Visit  Medication Sig Dispense Refill  . amLODipine (NORVASC) 5 MG tablet TAKE ONE TABLET BY MOUTH DAILY  30 tablet  6  . aspirin 81 MG tablet Take 81 mg by mouth daily.        . carvedilol (COREG) 3.125 MG tablet Take 3.125 mg by mouth 2 (two) times daily.        . clopidogrel (PLAVIX) 75 MG tablet Take 1 tablet (75 mg total) by mouth daily.  30 tablet  6  . doxazosin (CARDURA) 4 MG tablet Take 4 mg by mouth at bedtime.       . hydrochlorothiazide 25 MG tablet TAKE 1 TABLET BY MOUTH EVERY DAY  30 tablet  5  . isosorbide mononitrate (IMDUR) 30 MG 24 hr tablet TAKE ONE TABLET BY MOUTH DAILY  30 tablet  5  . L-Methylfolate-B6-B12 (METANX) 3-35-2 MG TABS Take 1 tablet by  mouth daily.        . metFORMIN (GLUCOPHAGE) 500 MG tablet Take 500 mg by mouth. As needed      . pantoprazole (PROTONIX) 40 MG tablet Take 40 mg by mouth daily.        . pravastatin (PRAVACHOL) 80 MG tablet TAKE 1 TABLET BY MOUTH EVERY NIGHT AT BEDTIME  30 tablet  6  . glipiZIDE (GLUCOTROL) 10 MG 24 hr tablet Take 10 mg by mouth daily.        . nitroGLYCERIN (NITROSTAT) 0.4 MG SL tablet Place 0.4 mg under the tongue every 5 (five) minutes as needed.          Past Medical History  Diagnosis Date  . CAD (coronary artery disease)     Severe native, Statuspost coronary bypass grafting 2004 with a I to the LAD and the sac was vein graft obtuse marginal and a  second vein graft to the diagonal. Preserved LV function with an ejection fraction of 55%  . Upper GI bleeding     history of upper GI bleeding status pst EGD and colonoscopy. Anicoagulation with aspirin and plavix  . Iron deficiency anemia     Secondary to GI bleed improved with current therapy  . Diabetes mellitus   . Chronic renal insufficiency     rather than baseline 1.56  . Hypertension   . Obesity   .  DJD (degenerative joint disease)   . Atrial fibrillation     History of postoperative atrial fibrillation  . Internal mammary artery injury     Continued patency of the internal mammary to the LAD, occlusion of the saphenous vein graft to the diagonal, severe multiple high-grade lesions with large clot burder in saphenous vein graft to the OM with small distal target, and high-grade in-stent restenosis    Past Surgical History  Procedure Date  . Coronary artery bypass graft   . Coronary stents     three  . Lumbar fusions     Family History  Problem Relation Age of Onset  . Heart disease Father   . Coronary artery disease Mother   . Coronary artery disease Brother     History   Social History  . Marital Status: Married    Spouse Name: N/A    Number of Children: N/A  . Years of Education: N/A   Occupational  History  . Not on file.   Social History Main Topics  . Smoking status: Never Smoker   . Smokeless tobacco: Not on file  . Alcohol Use: No  . Drug Use: No  . Sexually Active: Not on file   Other Topics Concern  . Not on file   Social History Narrative  . No narrative on file   [id number] positives as outlined above. The remainder of the 18  point review of systems is negative  PHYSICAL EXAM BP 155/68  Pulse 48  Ht 6\' 1"  (1.854 m)  Wt 266 lb 6.4 oz (120.838 kg)  BMI 35.15 kg/m2  General: Patient walking with a cane Head: Normocephalic and atraumatic Eyes:PERRLA/EOMI intact, conjunctiva and lids normal Ears: No deformity or lesions Mouth:normal dentition, normal posterior pharynx Neck: Supple, no JVD.  No masses, thyromegaly or abnormal cervical nodes Lungs: Normal breath sounds bilaterally without wheezing.  Normal percussion Cardiac: regular rate and rhythm with normal S1 and S2, no S3 or S4.  PMI is normal.  No pathological murmurs Abdomen: Normal bowel sounds, abdomen is soft and nontender without masses, organomegaly or hernias noted.  No hepatosplenomegaly MSK: Back normal, normal gait muscle strength and tone normal Vascular: Pulse is nonpalpable in dorsalis pedis and posterior tibial pulse bilaterally  Extremities: No peripheral pitting edema, severe varicosities of both lower extremities Neurologic: Alert and oriented x 3 Skin: Ulcer left foot with bandage  Lymphatics: No significant adenopathy Psychologic: Normal affect  ECG: Not available  ASSESSMENT AND PLAN

## 2010-09-04 NOTE — Patient Instructions (Signed)
   No Pletal  Increase Iron to 325mg  three times a day  MRI of chest in 6 months prior to next office visit  Your physician wants you to follow up in: 6 months.  You will receive a reminder letter in the mail one-two months in advance.  If you don't receive a letter, please call our office to schedule the follow up appointment

## 2010-09-04 NOTE — Assessment & Plan Note (Signed)
Ejection fraction remains stable with no symptoms of heart failure. Continue current medical regimen

## 2010-09-04 NOTE — Assessment & Plan Note (Signed)
I asked the patient to continue to take iron 3 times a day based on his iron studies.

## 2010-09-04 NOTE — Assessment & Plan Note (Signed)
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 In 6 months we'll obtain an MRI this is not possible because of prior orthopedic surgery and we will perform a TEE to establish accurate measurements.

## 2010-10-19 ENCOUNTER — Other Ambulatory Visit: Payer: Self-pay | Admitting: Cardiology

## 2010-11-21 ENCOUNTER — Other Ambulatory Visit: Payer: Self-pay | Admitting: Cardiology

## 2010-12-20 ENCOUNTER — Other Ambulatory Visit: Payer: Self-pay | Admitting: Cardiology

## 2010-12-31 ENCOUNTER — Other Ambulatory Visit: Payer: Self-pay | Admitting: Cardiology

## 2011-01-20 ENCOUNTER — Other Ambulatory Visit: Payer: Self-pay | Admitting: Cardiology

## 2011-02-26 ENCOUNTER — Telehealth: Payer: Self-pay | Admitting: Cardiology

## 2011-02-26 DIAGNOSIS — I714 Abdominal aortic aneurysm, without rupture: Secondary | ICD-10-CM

## 2011-02-26 NOTE — Telephone Encounter (Signed)
Spoke with wife [name]) & discussed below.  States she did talk with Onalee Hua in MRI & is okay to do with pins and rods in back.  Is waiting to hear back from Dr. Lajoyce Corners as to when they would like to schedule amputation.    Per your last office note:  Ascending aortic aneurysm - Peyton Bottoms, MD  09/04/2010  4:48 PM  Signed The descending aorta is dilated at 45 mm. In 6 months we'll obtain an MRI this is not possible because of prior orthopedic surgery and we will perform a TEE to establish accurate measurements.  Should this be MRI of chest with or without contrast?  Please advise on what exactly to order.

## 2011-02-26 NOTE — Telephone Encounter (Signed)
Benjamin [name] (wife) called today to say that Dr. Lajoyce Corners is going to have to do amputation of [name] BTK @ [facility]  States that hopefully can do under spinal.  Will need a cardiac clearance also. States that MRI needs to  Be scheduled also asap.

## 2011-02-27 NOTE — Telephone Encounter (Signed)
Go ahead and schedule MRI and also schedule appointment for preoperative clearance.

## 2011-03-01 ENCOUNTER — Other Ambulatory Visit: Payer: Self-pay | Admitting: Cardiology

## 2011-03-01 DIAGNOSIS — I714 Abdominal aortic aneurysm, without rupture: Secondary | ICD-10-CM

## 2011-03-01 MED ORDER — CLOPIDOGREL BISULFATE 75 MG PO TABS: 75.0000 mg | ORAL_TABLET | Freq: Every day | ORAL | Status: DC

## 2011-03-01 NOTE — Telephone Encounter (Signed)
Spoke with patient regarding below.  Order is in for MRI & will ask Vicky to go ahead with scheduling.  Wife [name]) will call back after Thursday to let us know when surgery is planned for as he has evaluation that day.

## 2011-03-01 NOTE — Telephone Encounter (Signed)
Left message to return call 

## 2011-03-05 ENCOUNTER — Other Ambulatory Visit: Payer: Self-pay | Admitting: *Deleted

## 2011-03-05 ENCOUNTER — Other Ambulatory Visit: Payer: Self-pay | Admitting: Cardiology

## 2011-03-05 ENCOUNTER — Encounter: Payer: Self-pay | Admitting: Cardiology

## 2011-03-05 DIAGNOSIS — I712 Thoracic aortic aneurysm, without rupture: Secondary | ICD-10-CM

## 2011-03-05 NOTE — Telephone Encounter (Signed)
Rescheduled appointment to 3/12 with MA.  Needed earlier to get clearance for surgery.

## 2011-03-12 ENCOUNTER — Telehealth: Payer: Self-pay | Admitting: *Deleted

## 2011-03-12 NOTE — Telephone Encounter (Signed)
Pt has Primary Physician Care and Medicare.  Per [employer], [phone], no precert required since being done at [facility].

## 2011-03-12 NOTE — Telephone Encounter (Signed)
MRA OFCHEST WITHOUT CONTRAST (DIAG) ASCENDING AORTIC ANEURYSM.  Dr. Andee Lineman   Friday, March 12, 2011 @ [facility]

## 2011-03-17 NOTE — Telephone Encounter (Signed)
Wife [name]) notified of clearance letter done by GD on 03-05-11.  She will still keep appointment for 3/12 with MA.

## 2011-03-23 ENCOUNTER — Encounter: Payer: Self-pay | Admitting: Cardiovascular Disease

## 2011-03-23 ENCOUNTER — Ambulatory Visit (INDEPENDENT_AMBULATORY_CARE_PROVIDER_SITE_OTHER): Payer: PRIVATE HEALTH INSURANCE | Admitting: Cardiovascular Disease

## 2011-03-23 VITALS — BP 157/73 | HR 63 | Ht 73.0 in | Wt 260.0 lb

## 2011-03-23 DIAGNOSIS — I771 Stricture of artery: Secondary | ICD-10-CM

## 2011-03-23 DIAGNOSIS — I708 Atherosclerosis of other arteries: Secondary | ICD-10-CM

## 2011-03-23 DIAGNOSIS — I2510 Atherosclerotic heart disease of native coronary artery without angina pectoris: Secondary | ICD-10-CM

## 2011-03-23 DIAGNOSIS — Z01810 Encounter for preprocedural cardiovascular examination: Secondary | ICD-10-CM

## 2011-03-23 NOTE — Patient Instructions (Signed)
   Follow up with Dr. Earnestine Leys after tests are complete. Your physician recommends that you continue on your current medications as directed. Please refer to the Current Medication list given to you today. Your physician has requested that you have a carotid duplex. This test is an ultrasound of the carotid arteries in your neck. It looks at blood flow through these arteries that supply the brain with blood. Allow one hour for this exam. There are no restrictions or special instructions.  Your physician has requested that you have a lower or upper extremity arterial duplex. This test is an ultrasound of the arteries in the legs or arms. It looks at arterial blood flow in the legs and arms. Allow one hour for Lower and Upper Arterial scans. There are no restrictions or special instructions

## 2011-03-26 DIAGNOSIS — I771 Stricture of artery: Secondary | ICD-10-CM | POA: Insufficient documentation

## 2011-03-26 NOTE — Assessment & Plan Note (Signed)
This was an incidental finding on recent MRA. His left radial pulse is slightly diminished. However, he does not report any claudication and blood pressure seems to be or equal in both arms. He does not have symptoms suggestive of steal syndrome. Given that he has a LIMA to LAD, he is at risk for coronary steal syndrome. I recommend further evaluation with an upper extremity arterial Doppler and carotid Doppler as well to evaluate the vertebral flow. This can be done after his lower extremity surgery. If the stenosis is deemed to be hemodynamically significant, it can likely be approached by angiography and possible percutaneous intervention.

## 2011-03-26 NOTE — Progress Notes (Signed)
HPI  This is a 78 year old male who is here today for a preoperative cardiovascular evaluation for anticipated below the knee amputation on the left side. The patient has known history of her peripheral arterial disease with severe below the knee disease on the left side. He has nonhealing ulcers and has been evaluated by Dr. Excell Seltzer last year with an angiogram which showed no good revascularization options. The patient has known history of coronary artery disease status post coronary artery bypass graft surgery. His vein grafts are occluded. However, the LIMA to LAD is patent. He has mild ischemic cardiomyopathy with an ejection fraction of 40-45%. The patient has been overall relatively stable from a cardiac standpoint. He denies any chest pain. He has chronic exertional dyspnea which has not changed. His physical capacity is somewhat limited due to his peripheral arterial disease. He has not had a recent cardiac events. He had an MRA recently to evaluate for possible aortic aneurysm. There was no evidence of aneurysm. However, there was suggestion of left subclavian stenosis. The patient is left-handed and denies any left arm claudication or discomfort.  Allergies  Allergen Reactions  . Penicillins     REACTION: rash     Current Outpatient Prescriptions on File Prior to Visit  Medication Sig Dispense Refill  . amLODipine (NORVASC) 5 MG tablet TAKE ONE TABLET BY MOUTH DAILY  30 tablet  6  . aspirin 81 MG tablet Take 81 mg by mouth daily.        . carvedilol (COREG) 3.125 MG tablet TAKE 1 TABLET BY MOUTH TWICE DAILY  60 tablet  5  . clopidogrel (PLAVIX) 75 MG tablet Take 1 tablet (75 mg total) by mouth daily.  30 tablet  6  . doxazosin (CARDURA) 4 MG tablet TAKE ONE TABLET BY MOUTH TWICE DAILY  60 tablet  5  . ferrous sulfate 325 (65 FE) MG tablet Take 1 tablet (325 mg total) by mouth 3 (three) times daily with meals.      Marland Kitchen glipiZIDE (GLUCOTROL) 10 MG 24 hr tablet Take 10 mg by mouth daily.         . hydrochlorothiazide (HYDRODIURIL) 25 MG tablet TAKE 1 TABLET BY MOUTH EVERY DAY  30 tablet  6  . isosorbide mononitrate (IMDUR) 30 MG 24 hr tablet TAKE ONE TABLET BY MOUTH DAILY  30 tablet  6  . L-Methylfolate-B6-B12 (METANX) 3-35-2 MG TABS Take 1 tablet by mouth daily.        . metFORMIN (GLUCOPHAGE) 500 MG tablet Take 500 mg by mouth. As needed      . nitroGLYCERIN (NITROSTAT) 0.4 MG SL tablet Place 0.4 mg under the tongue every 5 (five) minutes as needed.        . pantoprazole (PROTONIX) 40 MG tablet Take 40 mg by mouth daily.        . pravastatin (PRAVACHOL) 80 MG tablet TAKE 1 TABLET BY MOUTH EVERY NIGHT AT BEDTIME  30 tablet  6     Past Medical History  Diagnosis Date  . CAD (coronary artery disease)     Severe native, Statuspost coronary bypass grafting 2004 with a I to the LAD and the sac was vein graft obtuse marginal and a  second vein graft to the diagonal. Preserved LV function with an ejection fraction of 55%  . Upper GI bleeding     history of upper GI bleeding status pst EGD and colonoscopy. Anicoagulation with aspirin and plavix  . Iron deficiency anemia  Secondary to GI bleed improved with current therapy  . Diabetes mellitus   . Chronic renal insufficiency     rather than baseline 1.56  . Hypertension   . Obesity   . DJD (degenerative joint disease)   . Atrial fibrillation     History of postoperative atrial fibrillation  . Internal mammary artery injury     Continued patency of the internal mammary to the LAD, occlusion of the saphenous vein graft to the diagonal, severe multiple high-grade lesions with large clot burder in saphenous vein graft to the OM with small distal target, and high-grade in-stent restenosis     Past Surgical History  Procedure Date  . Coronary artery bypass graft   . Coronary stents     three  . Lumbar fusions      Family History  Problem Relation Age of Onset  . Heart disease Father   . Coronary artery disease Mother    . Coronary artery disease Brother      History   Social History  . Marital Status: Married    Spouse Name: N/A    Number of Children: N/A  . Years of Education: N/A   Occupational History  . Not on file.   Social History Main Topics  . Smoking status: Never Smoker   . Smokeless tobacco: Never Used  . Alcohol Use: No  . Drug Use: No  . Sexually Active: Not on file   Other Topics Concern  . Not on file   Social History Narrative  . No narrative on file      PHYSICAL EXAM   BP 157/73  Pulse 63  Ht 6\' 1"  (1.854 m)  Wt 260 lb (117.935 kg)  BMI 34.30 kg/m2  Constitutional: He is oriented to person, place, and time. He appears well-developed and well-nourished. No distress.  HENT: No nasal discharge.  Head: Normocephalic and atraumatic.  Eyes: Pupils are equal and round. Right eye exhibits no discharge. Left eye exhibits no discharge.  Neck: Normal range of motion. Neck supple. No JVD present. No thyromegaly present.  Cardiovascular: Normal rate, regular rhythm, normal heart sounds and. Exam reveals no gallop and no friction rub. No murmur heard.  Pulmonary/Chest: Effort normal and breath sounds normal. No stridor. No respiratory distress. He has no wheezes. He has no rales. He exhibits no tenderness.  Abdominal: Soft. Bowel sounds are normal. He exhibits no distension. There is no tenderness. There is no rebound and no guarding.  Musculoskeletal: Normal range of motion. He exhibits no edema and no tenderness.  Neurological: He is alert and oriented to person, place, and time. Coordination normal.  Skin: Skin is warm and dry. No rash noted. He is not diaphoretic. No erythema. No pallor.  Psychiatric: He has a normal mood and affect. His behavior is normal. Judgment and thought content normal.  Right radial pulse is normal. Left radial pulse is slightly diminished. Blood pressure is equal in both arms.   EKG: Normal sinus rhythm with T-wave inversion in the lateral  leads.   ASSESSMENT AND PLAN

## 2011-03-26 NOTE — Assessment & Plan Note (Signed)
The patient has known history of coronary artery disease status post coronary artery bypass graft surgery. He does not have significant angina and appears to be stable from a cardiac standpoint. His most recent cardiac catheterization showed occluded vein grafts but LIMA to LAD was patent. His ejection fraction was 40-45%. The patient cardiovascular risk for the planned surgery is moderate. Preoperative ischemic evaluation is likely not helpful in this situation. Continue medical therapy. Plavix can be stopped 1 week before the surgery. Aspirin 81 mg once daily should be continued if possible. Carvedilol should be continued throughout his perioperative course.

## 2011-04-01 ENCOUNTER — Encounter (HOSPITAL_COMMUNITY): Payer: Self-pay | Admitting: Pharmacy Technician

## 2011-04-01 ENCOUNTER — Ambulatory Visit: Payer: Medicare Other | Admitting: Cardiology

## 2011-04-01 ENCOUNTER — Other Ambulatory Visit (HOSPITAL_COMMUNITY): Payer: Self-pay | Admitting: Orthopedic Surgery

## 2011-04-02 ENCOUNTER — Encounter (HOSPITAL_COMMUNITY): Payer: Self-pay

## 2011-04-02 ENCOUNTER — Encounter (HOSPITAL_COMMUNITY)
Admission: RE | Admit: 2011-04-02 | Discharge: 2011-04-02 | Disposition: A | Discharge status: Home / Self Care | Payer: PRIVATE HEALTH INSURANCE | Source: Ambulatory Visit | Attending: Orthopedic Surgery | Admitting: Orthopedic Surgery

## 2011-04-02 HISTORY — DX: Hyperlipidemia, unspecified: E78.5

## 2011-04-02 HISTORY — DX: Peripheral vascular disease, unspecified: I73.9

## 2011-04-02 HISTORY — DX: Gastro-esophageal reflux disease without esophagitis: K21.9

## 2011-04-02 LAB — COMPREHENSIVE METABOLIC PANEL
Alkaline Phosphatase: 56 U/L (ref 39–117)
BUN: 30 mg/dL — ABNORMAL HIGH (ref 6–23)
Chloride: 104 mEq/L (ref 96–112)
GFR calc Af Amer: 45 mL/min — ABNORMAL LOW (ref >90)
Glucose, Bld: 161 mg/dL — ABNORMAL HIGH (ref 70–99)
Potassium: 4.8 mEq/L (ref 3.5–5.1)
Total Bilirubin: 0.3 mg/dL (ref 0.3–1.2)

## 2011-04-02 LAB — APTT: aPTT: 30 seconds (ref 24–37)

## 2011-04-02 LAB — CBC
HCT: 34.7 % — ABNORMAL LOW (ref 39.0–52.0)
Hemoglobin: 11.5 g/dL — ABNORMAL LOW (ref 13.0–17.0)
WBC: 7.7 10*3/uL (ref 4.0–10.5)

## 2011-04-02 LAB — PROTIME-INR: Prothrombin Time: 15.2 seconds (ref 11.6–15.2)

## 2011-04-02 NOTE — Progress Notes (Signed)
Attempted to reach patient regarding PAT appointment.  Left voice message for return call.

## 2011-04-02 NOTE — Progress Notes (Signed)
Forwarded to [facility] for review of cardiac notes,EKG, cath/echo.

## 2011-04-02 NOTE — Pre-Procedure Instructions (Signed)
20 SAHEJ SCHRIEBER  04/02/2011   Your procedure is scheduled on:  Wednesday March, 27, 2013  Report to [facility] at 0800 AM.  Call this number if you have problems the morning of surgery: [phone]   Remember:   Do not eat food:After Midnight.  May have clear liquids: up to 4 Hours before arrival. (up to 4:00am)  Clear liquids include soda, tea, black coffee, apple or grape juice, broth.  Take these medicines the morning of surgery with A SIP OF WATER: amlodipine, coreg, cipro, isosorbide, protonix, tramadol, cardura   Do not wear jewelry, make-up or nail polish.  Do not wear lotions, powders, or perfumes. You may wear deodorant.  Do not shave 48 hours prior to surgery.  Do not bring valuables to the hospital.  Contacts, dentures or bridgework may not be worn into surgery.  Leave suitcase in the car. After surgery it may be brought to your room.  For patients admitted to the hospital, checkout time is 11:00 AM the day of discharge.   Patients discharged the day of surgery will not be allowed to drive home.  Name and phone number of your driver: [name] [facility] [phone]  Special Instructions: Incentive Spirometry - Practice and bring it with you on the day of surgery. and CHG Shower Use Special Wash: 1/2 bottle night before surgery and 1/2 bottle morning of surgery.   Please read over the following fact sheets that you were given: Pain Booklet, Coughing and Deep Breathing, MRSA Information and Surgical Site Infection Prevention

## 2011-04-05 NOTE — Consult Note (Signed)
Anesthesia: Patient is a 78 year old male scheduled for a left partial calcaneal excision, place antibiotic beads, wound VAC on 04/07/11.  His history includes CAD/CABG '04 with occluded vein grafts but patent LIMA to LAD, s/p DES marginal branch of the CX through a vein graft in 12/2004, ischemic CM with EF 40-45%, PVD, upper GIB, iron deficiency anemia, DM2, CKD with baseline Cr 1.56, HTN, obesity, DJD, post-op afib, non-smoker.  He was also noted to have left SCA stenosis on recent MRA.  I believe Dr. Earnestine Leys is his primary Cardiologist, but he was recently evaluated by his partner Cardiologist Dr. Kirke Corin for pre-operative evaluation. He did not recommend any preoperative ischemic evaluation at this time.  He was felt to be at moderate CV risk.  He gave permission to hold his Plavix, but recommended continued use of ASA (if possible) and Carvedilol.  Due to the incidental finding of left SCA stenosis, he is at risk for coronary steal syndrome.  By notes, his left RA was slightly diminished but he was otherwise asymptomatic with equal BP readings in each arm.  Dr. Kirke Corin recommended further evaluation with an upper extremity arterial Doppler and carotid Doppler to evaluate the vertebral flow. If the stenosis is deemed to be hemodynamically significant, he felt it could likely be approached by angiography and possible percutaneous intervention.  He said these could be done after his lower extremity surgery.  Echo on 08/19/10 at [facility] (scanned into Epic) showed EF 40-45%, moderately decreased LV systolic function, multiple regional wall motion abnormalities, normal RV systolic function, mild MR/PR, moderate dilatation of the ascending aorta (Ao diameter 45 mm).  (He later had a MRI/MRA of the chest on 03/12/11 at [facility] which was negative for a thoracic aortic aneurysm or dissection, but showed proximal left SCA stenosis.)  His last cardiac cath was on 05/23/09 that showed patent LIMA to LAD, occluded  SVG to the DIAG, severe multiple high-grade lesions with large clot burden in the SVG to the OM with small distal target, and hight grade in-stent restenosis, severe native CAD.  EF was on ly 31% at that time.  E[facility] from 03/23/11 at [facility] showed NSR, lateral T wave inversion, possible inferior infarct.  T wave inversion now more prominent, but otherwise stable.  CXR from 07/01/10 showed Cardiomegaly, chronic lung changes, and prior CABG. Currently no congestive heart failure or active disease.   Labs noted.  Cr a little elevated from baseline at 1.66.  BUN 30. H/H 11.5/34.7.  Overall acceptable.  Plan to proceed if no new CV symptoms.

## 2011-04-06 MED ORDER — CLINDAMYCIN PHOSPHATE 600 MG/50ML IV SOLN
600.0000 mg | INTRAVENOUS | Status: DC
  Filled 2011-04-06: qty 50

## 2011-04-07 ENCOUNTER — Ambulatory Visit (HOSPITAL_COMMUNITY)
Admission: RE | Admit: 2011-04-07 | Discharge: 2011-04-09 | Disposition: A | Discharge status: Home / Self Care | Payer: PRIVATE HEALTH INSURANCE | Source: Ambulatory Visit | Attending: Orthopedic Surgery | Admitting: Orthopedic Surgery

## 2011-04-07 ENCOUNTER — Encounter (HOSPITAL_COMMUNITY)
Admission: RE | Disposition: A | Discharge status: Home / Self Care | Payer: Self-pay | Source: Ambulatory Visit | Attending: Orthopedic Surgery

## 2011-04-07 ENCOUNTER — Encounter (HOSPITAL_COMMUNITY): Payer: Self-pay | Admitting: Vascular Surgery

## 2011-04-07 ENCOUNTER — Encounter (HOSPITAL_COMMUNITY): Payer: Self-pay

## 2011-04-07 ENCOUNTER — Ambulatory Visit (HOSPITAL_COMMUNITY): Payer: PRIVATE HEALTH INSURANCE | Admitting: Vascular Surgery

## 2011-04-07 DIAGNOSIS — E1149 Type 2 diabetes mellitus with other diabetic neurological complication: Secondary | ICD-10-CM | POA: Insufficient documentation

## 2011-04-07 DIAGNOSIS — E1169 Type 2 diabetes mellitus with other specified complication: Secondary | ICD-10-CM | POA: Insufficient documentation

## 2011-04-07 DIAGNOSIS — I2510 Atherosclerotic heart disease of native coronary artery without angina pectoris: Secondary | ICD-10-CM | POA: Insufficient documentation

## 2011-04-07 DIAGNOSIS — Z01812 Encounter for preprocedural laboratory examination: Secondary | ICD-10-CM | POA: Insufficient documentation

## 2011-04-07 DIAGNOSIS — E1142 Type 2 diabetes mellitus with diabetic polyneuropathy: Secondary | ICD-10-CM | POA: Insufficient documentation

## 2011-04-07 DIAGNOSIS — M9080 Osteopathy in diseases classified elsewhere, unspecified site: Secondary | ICD-10-CM | POA: Insufficient documentation

## 2011-04-07 DIAGNOSIS — M869 Osteomyelitis, unspecified: Secondary | ICD-10-CM | POA: Insufficient documentation

## 2011-04-07 HISTORY — PX: I&D EXTREMITY: SHX5045

## 2011-04-07 LAB — GLUCOSE, CAPILLARY
Glucose-Capillary: 168 mg/dL — ABNORMAL HIGH (ref 70–99)
Glucose-Capillary: 168 mg/dL — ABNORMAL HIGH (ref 70–99)

## 2011-04-07 SURGERY — IRRIGATION AND DEBRIDEMENT EXTREMITY
Anesthesia: General | Site: Foot | Laterality: Left | Wound class: Contaminated

## 2011-04-07 MED ORDER — CIPROFLOXACIN HCL 500 MG PO TABS
500.0000 mg | ORAL_TABLET | Freq: Every day | ORAL | Status: DC
  Administered 2011-04-08 – 2011-04-09 (×2): 500 mg via ORAL
  Filled 2011-04-07 (×2): qty 1

## 2011-04-07 MED ORDER — SODIUM CHLORIDE 0.9 % IR SOLN
Status: DC | PRN
  Administered 2011-04-07: 11:00:00

## 2011-04-07 MED ORDER — VANCOMYCIN HCL 1000 MG IV SOLR
INTRAVENOUS | Status: DC | PRN
  Administered 2011-04-07: 1000 mg

## 2011-04-07 MED ORDER — ONDANSETRON HCL 4 MG/2ML IJ SOLN
4.0000 mg | Freq: Four times a day (QID) | INTRAMUSCULAR | Status: DC | PRN
  Administered 2011-04-07 – 2011-04-08 (×2): 4 mg via INTRAVENOUS
  Filled 2011-04-07 (×2): qty 2

## 2011-04-07 MED ORDER — ACETAMINOPHEN 10 MG/ML IV SOLN
1000.0000 mg | Freq: Once | INTRAVENOUS | Status: AC
  Administered 2011-04-07: 1000 mg via INTRAVENOUS

## 2011-04-07 MED ORDER — CEFAZOLIN SODIUM 1-5 GM-% IV SOLN
INTRAVENOUS | Status: AC
  Filled 2011-04-07: qty 100

## 2011-04-07 MED ORDER — LACTATED RINGERS IV SOLN
INTRAVENOUS | Status: DC | PRN
  Administered 2011-04-07: 10:00:00 via INTRAVENOUS

## 2011-04-07 MED ORDER — ISOSORBIDE MONONITRATE ER 30 MG PO TB24
30.0000 mg | ORAL_TABLET | Freq: Every day | ORAL | Status: DC
  Administered 2011-04-08 – 2011-04-09 (×2): 30 mg via ORAL
  Filled 2011-04-07 (×2): qty 1

## 2011-04-07 MED ORDER — AMLODIPINE BESYLATE 5 MG PO TABS
5.0000 mg | ORAL_TABLET | Freq: Every day | ORAL | Status: DC
  Administered 2011-04-08 – 2011-04-09 (×2): 5 mg via ORAL
  Filled 2011-04-07 (×2): qty 1

## 2011-04-07 MED ORDER — METOCLOPRAMIDE HCL 10 MG PO TABS
5.0000 mg | ORAL_TABLET | Freq: Three times a day (TID) | ORAL | Status: DC | PRN
  Administered 2011-04-08: 10 mg via ORAL
  Filled 2011-04-07: qty 2

## 2011-04-07 MED ORDER — ONDANSETRON HCL 4 MG/2ML IJ SOLN
INTRAMUSCULAR | Status: DC | PRN
  Administered 2011-04-07: 4 mg via INTRAVENOUS

## 2011-04-07 MED ORDER — HYDROMORPHONE HCL PF 1 MG/ML IJ SOLN
0.2500 mg | INTRAMUSCULAR | Status: AC | PRN
  Administered 2011-04-07 (×8): 0.5 mg via INTRAVENOUS

## 2011-04-07 MED ORDER — ADULT MULTIVITAMIN W/MINERALS CH
1.0000 | ORAL_TABLET | Freq: Every day | ORAL | Status: DC
  Administered 2011-04-08 – 2011-04-09 (×2): 1 via ORAL
  Filled 2011-04-07 (×2): qty 1

## 2011-04-07 MED ORDER — MIDAZOLAM HCL 5 MG/5ML IJ SOLN
INTRAMUSCULAR | Status: DC | PRN
  Administered 2011-04-07: 1 mg via INTRAVENOUS

## 2011-04-07 MED ORDER — CARVEDILOL 3.125 MG PO TABS
3.1250 mg | ORAL_TABLET | Freq: Two times a day (BID) | ORAL | Status: DC
  Administered 2011-04-07 – 2011-04-09 (×3): 3.125 mg via ORAL
  Filled 2011-04-07 (×6): qty 1

## 2011-04-07 MED ORDER — INSULIN ASPART 100 UNIT/ML ~~LOC~~ SOLN
0.0000 [IU] | Freq: Three times a day (TID) | SUBCUTANEOUS | Status: DC
  Administered 2011-04-08: 2 [IU] via SUBCUTANEOUS
  Administered 2011-04-08: 3 [IU] via SUBCUTANEOUS

## 2011-04-07 MED ORDER — FERROUS SULFATE 325 (65 FE) MG PO TABS
325.0000 mg | ORAL_TABLET | Freq: Three times a day (TID) | ORAL | Status: DC
  Administered 2011-04-07 – 2011-04-09 (×5): 325 mg via ORAL
  Filled 2011-04-07 (×8): qty 1

## 2011-04-07 MED ORDER — PANTOPRAZOLE SODIUM 40 MG PO TBEC
40.0000 mg | DELAYED_RELEASE_TABLET | Freq: Every day | ORAL | Status: DC
  Administered 2011-04-07 – 2011-04-09 (×3): 40 mg via ORAL
  Filled 2011-04-07 (×3): qty 1

## 2011-04-07 MED ORDER — HYDROMORPHONE HCL PF 1 MG/ML IJ SOLN
INTRAMUSCULAR | Status: AC
  Administered 2011-04-07: 0.5 mg via INTRAVENOUS
  Filled 2011-04-07: qty 1

## 2011-04-07 MED ORDER — HYDROCHLOROTHIAZIDE 25 MG PO TABS
25.0000 mg | ORAL_TABLET | Freq: Every day | ORAL | Status: DC
  Administered 2011-04-08 – 2011-04-09 (×2): 25 mg via ORAL
  Filled 2011-04-07 (×2): qty 1

## 2011-04-07 MED ORDER — ONDANSETRON HCL 4 MG PO TABS: 4.0000 mg | ORAL_TABLET | Freq: Four times a day (QID) | ORAL | Status: DC | PRN

## 2011-04-07 MED ORDER — GLIPIZIDE ER 10 MG PO TB24
10.0000 mg | ORAL_TABLET | Freq: Every day | ORAL | Status: DC
  Administered 2011-04-08 – 2011-04-09 (×2): 10 mg via ORAL
  Filled 2011-04-07 (×3): qty 1

## 2011-04-07 MED ORDER — FENTANYL CITRATE 0.05 MG/ML IJ SOLN
INTRAMUSCULAR | Status: DC | PRN
  Administered 2011-04-07: 100 ug via INTRAVENOUS
  Administered 2011-04-07: 25 ug via INTRAVENOUS
  Administered 2011-04-07 (×2): 50 ug via INTRAVENOUS

## 2011-04-07 MED ORDER — ONDANSETRON HCL 4 MG/2ML IJ SOLN: 4.0000 mg | Freq: Once | INTRAMUSCULAR | Status: DC | PRN

## 2011-04-07 MED ORDER — LIDOCAINE HCL (CARDIAC) 20 MG/ML IV SOLN
INTRAVENOUS | Status: DC | PRN
  Administered 2011-04-07: 100 mg via INTRAVENOUS

## 2011-04-07 MED ORDER — CEFAZOLIN SODIUM 1-5 GM-% IV SOLN
INTRAVENOUS | Status: DC | PRN
  Administered 2011-04-07: 2 g via INTRAVENOUS

## 2011-04-07 MED ORDER — METFORMIN HCL 500 MG PO TABS
500.0000 mg | ORAL_TABLET | Freq: Two times a day (BID) | ORAL | Status: DC
  Administered 2011-04-08 – 2011-04-09 (×2): 500 mg via ORAL
  Filled 2011-04-07 (×5): qty 1

## 2011-04-07 MED ORDER — HYDROMORPHONE HCL PF 1 MG/ML IJ SOLN: 0.5000 mg | INTRAMUSCULAR | Status: DC | PRN

## 2011-04-07 MED ORDER — SODIUM CHLORIDE 0.9 % IR SOLN
Status: DC | PRN
  Administered 2011-04-07: 1000 mL

## 2011-04-07 MED ORDER — SODIUM CHLORIDE 0.9 % IV SOLN
INTRAVENOUS | Status: DC
  Administered 2011-04-07: 1000 mL via INTRAVENOUS
  Administered 2011-04-08: 20 mL/h via INTRAVENOUS
  Administered 2011-04-08: 21:00:00 via INTRAVENOUS

## 2011-04-07 MED ORDER — VANCOMYCIN HCL 1000 MG IV SOLR: 1000.0000 mg | INTRAVENOUS | Status: DC

## 2011-04-07 MED ORDER — METOCLOPRAMIDE HCL 5 MG/ML IJ SOLN
5.0000 mg | Freq: Three times a day (TID) | INTRAMUSCULAR | Status: DC | PRN
  Administered 2011-04-08: 10 mg via INTRAVENOUS
  Filled 2011-04-07: qty 2

## 2011-04-07 MED ORDER — OXYCODONE-ACETAMINOPHEN 5-325 MG PO TABS
1.0000 | ORAL_TABLET | ORAL | Status: DC | PRN
  Administered 2011-04-07 – 2011-04-08 (×4): 2 via ORAL
  Administered 2011-04-08: 1 via ORAL
  Filled 2011-04-07 (×7): qty 2

## 2011-04-07 MED ORDER — PROPOFOL 10 MG/ML IV EMUL
INTRAVENOUS | Status: DC | PRN
  Administered 2011-04-07: 200 mg via INTRAVENOUS

## 2011-04-07 MED ORDER — HYDROMORPHONE HCL PF 1 MG/ML IJ SOLN
0.5000 mg | INTRAMUSCULAR | Status: DC | PRN
  Administered 2011-04-07 (×2): 1 mg via INTRAVENOUS
  Administered 2011-04-07: 0.5 mg via INTRAVENOUS
  Administered 2011-04-08 – 2011-04-09 (×6): 1 mg via INTRAVENOUS
  Filled 2011-04-07 (×9): qty 1

## 2011-04-07 MED ORDER — CEFAZOLIN SODIUM-DEXTROSE 2-3 GM-% IV SOLR
2.0000 g | Freq: Four times a day (QID) | INTRAVENOUS | Status: AC
  Administered 2011-04-07 – 2011-04-08 (×3): 2 g via INTRAVENOUS
  Filled 2011-04-07 (×3): qty 50

## 2011-04-07 MED ORDER — CLOPIDOGREL BISULFATE 75 MG PO TABS
75.0000 mg | ORAL_TABLET | Freq: Every day | ORAL | Status: DC
  Administered 2011-04-08 – 2011-04-09 (×2): 75 mg via ORAL
  Filled 2011-04-07 (×3): qty 1

## 2011-04-07 MED ORDER — EPHEDRINE SULFATE 50 MG/ML IJ SOLN
INTRAMUSCULAR | Status: DC | PRN
  Administered 2011-04-07 (×3): 5 mg via INTRAVENOUS

## 2011-04-07 MED ORDER — DOXAZOSIN MESYLATE 4 MG PO TABS
4.0000 mg | ORAL_TABLET | Freq: Two times a day (BID) | ORAL | Status: DC
  Administered 2011-04-07 – 2011-04-09 (×3): 4 mg via ORAL
  Filled 2011-04-07 (×5): qty 1

## 2011-04-07 MED ORDER — ACETAMINOPHEN 10 MG/ML IV SOLN
INTRAVENOUS | Status: AC
  Administered 2011-04-07: 1000 mg via INTRAVENOUS
  Filled 2011-04-07: qty 100

## 2011-04-07 MED ORDER — CIPROFLOXACIN HCL 500 MG PO TABS
500.0000 mg | ORAL_TABLET | Freq: Every day | ORAL | Status: DC
  Filled 2011-04-07: qty 1

## 2011-04-07 MED ORDER — MORPHINE SULFATE 2 MG/ML IJ SOLN: 0.0500 mg/kg | INTRAMUSCULAR | Status: DC | PRN

## 2011-04-07 MED ORDER — SIMVASTATIN 5 MG PO TABS
5.0000 mg | ORAL_TABLET | Freq: Every day | ORAL | Status: DC
  Administered 2011-04-07 – 2011-04-08 (×2): 5 mg via ORAL
  Filled 2011-04-07 (×3): qty 1

## 2011-04-07 SURGICAL SUPPLY — 51 items
BANDAGE GAUZE ELAST BULKY 4 IN (GAUZE/BANDAGES/DRESSINGS) ×1 IMPLANT
BLADE SURG 10 STRL SS (BLADE) ×1 IMPLANT
BNDG COHESIVE 4X5 TAN STRL (GAUZE/BANDAGES/DRESSINGS) ×2 IMPLANT
BNDG COHESIVE 6X5 TAN STRL LF (GAUZE/BANDAGES/DRESSINGS) ×3 IMPLANT
BNDG GAUZE STRTCH 6 (GAUZE/BANDAGES/DRESSINGS) ×6 IMPLANT
CLOTH BEACON ORANGE TIMEOUT ST (SAFETY) ×2 IMPLANT
COTTON STERILE ROLL (GAUZE/BANDAGES/DRESSINGS) ×2 IMPLANT
COVER SURGICAL LIGHT HANDLE (MISCELLANEOUS) ×2 IMPLANT
CUFF TOURNIQUET SINGLE 18IN (TOURNIQUET CUFF) ×1 IMPLANT
CUFF TOURNIQUET SINGLE 24IN (TOURNIQUET CUFF) IMPLANT
CUFF TOURNIQUET SINGLE 34IN LL (TOURNIQUET CUFF) IMPLANT
CUFF TOURNIQUET SINGLE 44IN (TOURNIQUET CUFF) IMPLANT
DRAPE U-SHAPE 47X51 STRL (DRAPES) ×2 IMPLANT
DRSG ADAPTIC 3X8 NADH LF (GAUZE/BANDAGES/DRESSINGS) ×2 IMPLANT
DRSG EMULSION OIL 3X3 NADH (GAUZE/BANDAGES/DRESSINGS) ×1 IMPLANT
DURAPREP 26ML APPLICATOR (WOUND CARE) ×2 IMPLANT
ELECT CAUTERY BLADE 6.4 (BLADE) IMPLANT
ELECT REM PT RETURN 9FT ADLT (ELECTROSURGICAL)
ELECTRODE REM PT RTRN 9FT ADLT (ELECTROSURGICAL) IMPLANT
GLOVE BIOGEL PI IND STRL 9 (GLOVE) ×1 IMPLANT
GLOVE BIOGEL PI INDICATOR 9 (GLOVE) ×1
GLOVE SURG ORTHO 9.0 STRL STRW (GLOVE) ×2 IMPLANT
GOWN PREVENTION PLUS XLARGE (GOWN DISPOSABLE) ×2 IMPLANT
GOWN SRG XL XLNG 56XLVL 4 (GOWN DISPOSABLE) ×1 IMPLANT
GOWN STRL NON-REIN XL XLG LVL4 (GOWN DISPOSABLE) ×2
HANDPIECE INTERPULSE COAX TIP (DISPOSABLE)
KIT BASIN OR (CUSTOM PROCEDURE TRAY) ×2 IMPLANT
KIT ROOM TURNOVER OR (KITS) ×2 IMPLANT
KIT STIMULAN RAPID CURE  10CC (Orthopedic Implant) ×1 IMPLANT
KIT STIMULAN RAPID CURE 10CC (Orthopedic Implant) IMPLANT
MANIFOLD NEPTUNE II (INSTRUMENTS) ×2 IMPLANT
MATRIX SURGICAL PSM 7X10CM (Tissue) ×1 IMPLANT
MICROMATRIX 500MG (Tissue) ×2 IMPLANT
NS IRRIG 1000ML POUR BTL (IV SOLUTION) ×2 IMPLANT
PACK ORTHO EXTREMITY (CUSTOM PROCEDURE TRAY) ×2 IMPLANT
PAD ARMBOARD 7.5X6 YLW CONV (MISCELLANEOUS) ×4 IMPLANT
PADDING CAST ABS 6INX4YD NS (CAST SUPPLIES) ×1
PADDING CAST ABS COTTON 6X4 NS (CAST SUPPLIES) ×1 IMPLANT
PADDING CAST COTTON 6X4 STRL (CAST SUPPLIES) ×2 IMPLANT
SET HNDPC FAN SPRY TIP SCT (DISPOSABLE) IMPLANT
SOLUTION PARTIC MCRMTRX 500MG (Tissue) IMPLANT
SPONGE GAUZE 4X4 12PLY (GAUZE/BANDAGES/DRESSINGS) ×2 IMPLANT
SPONGE LAP 18X18 X RAY DECT (DISPOSABLE) ×1 IMPLANT
STOCKINETTE IMPERVIOUS 9X36 MD (GAUZE/BANDAGES/DRESSINGS) ×2 IMPLANT
TOWEL OR 17X24 6PK STRL BLUE (TOWEL DISPOSABLE) ×2 IMPLANT
TOWEL OR 17X26 10 PK STRL BLUE (TOWEL DISPOSABLE) ×2 IMPLANT
TUBE ANAEROBIC SPECIMEN COL (MISCELLANEOUS) IMPLANT
TUBE CONNECTING 12X1/4 (SUCTIONS) ×2 IMPLANT
UNDERPAD 30X30 INCONTINENT (UNDERPADS AND DIAPERS) ×2 IMPLANT
WATER STERILE IRR 1000ML POUR (IV SOLUTION) ×2 IMPLANT
YANKAUER SUCT BULB TIP NO VENT (SUCTIONS) ×2 IMPLANT

## 2011-04-07 NOTE — Progress Notes (Signed)
Orthopedic Tech Progress Note Patient Details:  Benjamin [name] 12-Feb-1933 [id number]  Patient ID: Benjamin [name], male   DOB: 1933-07-19, 78 y.o.   MRN: [id number] Viewed order from doctor's order list  Nikki Dom 04/07/2011, 8:03 PM

## 2011-04-07 NOTE — Progress Notes (Signed)
Orthopedic Tech Progress Note Patient Details:  Benjamin [name] 1933-12-13 [id number]  Other Ortho Devices Type of Ortho Device: Postop boot Ortho Device Location: left foot Ortho Device Interventions: Application   Nikki Dom 04/07/2011, 8:02 PM

## 2011-04-07 NOTE — H&P (Signed)
Benjamin [name] is an 78 y.o. male.   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  Past Medical History  Diagnosis Date  . CAD (coronary artery disease)     Severe native, Statuspost coronary bypass grafting 2004 with a I to the LAD and the sac was vein graft obtuse marginal and a  second vein graft to the diagonal. Preserved LV function with an ejection fraction of 55%  . Upper GI bleeding     history of upper GI bleeding status pst EGD and colonoscopy. Anicoagulation with aspirin and plavix  . Iron deficiency anemia     Secondary to GI bleed improved with current therapy  . Diabetes mellitus   . Hypertension   . Obesity   . DJD (degenerative joint disease)   . Internal mammary artery injury     Continued patency of the internal mammary to the LAD, occlusion of the saphenous vein graft to the diagonal, severe multiple high-grade lesions with large clot burder in saphenous vein graft to the OM with small distal target, and high-grade in-stent restenosis  . Atrial fibrillation     History of postoperative atrial fibrillation  . Chronic renal insufficiency     rather than baseline 1.56  . GERD (gastroesophageal reflux disease)   . Peripheral vascular disease     s/p arteriogram by Dr. Excell Seltzer, bilaterally  . Pneumonia   . Hyperlipidemia     Past Surgical History  Procedure Date  . Coronary stents     three  . Lumbar fusions   . Laminectomy   . Anterior cervical discectomy   . Cardiac catheterization     2006  . Coronary artery bypass graft     stents x 3    Family History  Problem Relation Age of Onset  . Heart disease Father   . Coronary artery disease Mother   . Coronary artery disease Brother    Social History:   reports that he has never smoked. He has never used smokeless tobacco. He reports that he does not drink alcohol or use illicit drugs.  Allergies:  Allergies  Allergen Reactions  . Penicillins     REACTION: rash    Medications Prior to Admission  Medication Dose Route Frequency Provider Last Rate Last Dose  . clindamycin (CLEOCIN) IVPB 600 mg  600 mg Intravenous 60 min Pre-Op Nadara Mustard, MD       Medications Prior to Admission  Medication Sig Dispense Refill  . aspirin 81 MG tablet Take 81 mg by mouth daily.        . ciprofloxacin (CIPRO) 500 MG tablet Take 500 mg by mouth daily. Maintenance      . ferrous sulfate 325 (65 FE) MG tablet Take 1 tablet (325 mg total) by mouth 3 (three) times daily with meals.      Marland Kitchen glipiZIDE (GLUCOTROL) 10 MG 24 hr tablet Take 10 mg by mouth daily.        Marland Kitchen L-Methylfolate-B6-B12 (METANX) 3-35-2 MG TABS Take 1 tablet by mouth daily.        . metFORMIN (GLUCOPHAGE) 500 MG tablet Take 500 mg by mouth 2 (two) times daily as needed. For diabetes      . nitroGLYCERIN (NITROSTAT) 0.4 MG SL tablet Place 0.4 mg under the tongue every  5 (five) minutes as needed. For chest pain      . pantoprazole (PROTONIX) 40 MG tablet Take 40 mg by mouth daily.        . traMADol (ULTRAM) 50 MG tablet Take 50 mg by mouth every 6 (six) hours as needed. For pain       . clopidogrel (PLAVIX) 75 MG tablet Take 1 tablet (75 mg total) by mouth daily.  30 tablet  6    No results found for this or any previous visit (from the past 48 hour(s)). No results found.  Review of Systems  All other systems reviewed and are negative.    There were no vitals taken for this visit. Physical Exam  Examination patient has diminished pulses. He does not have protective sensation. He has a Wagner grade 3 ulcer with osteomyelitis of the left calcaneus. Assessment/Plan Assessment: Osteomyelitis and Wagner grade 3 ulcer diabetic insensate neuropathy left heel.  Plan: We'll plan for partial  calcaneal excision placement of antibiotic beads and wound closure versus placement of a wound VAC. Risks and benefits were discussed including persistent infection nonhealing of the wound potential for transtibial amputation. Patient states he understands and wished to proceed at this time.  Nyonna Hargrove V 04/07/2011, 6:16 AM

## 2011-04-07 NOTE — Anesthesia Preprocedure Evaluation (Signed)
Anesthesia Evaluation  Patient identified by MRN, date of birth, ID band Patient awake    Reviewed: Allergy & Precautions, H&P , NPO status , Patient's Chart, lab work & pertinent test results  Airway Mallampati: I TM Distance: >3 FB Neck ROM: Full    Dental  (+) Teeth Intact and Dental Advisory Given   Pulmonary  breath sounds clear to auscultation        Cardiovascular Rhythm:Regular Rate:Normal     Neuro/Psych    GI/Hepatic   Endo/Other    Renal/GU      Musculoskeletal   Abdominal   Peds  Hematology   Anesthesia Other Findings   Reproductive/Obstetrics                           Anesthesia Physical Anesthesia Plan  ASA: III  Anesthesia Plan: General   Post-op Pain Management:    Induction: Intravenous  Airway Management Planned: Oral ETT  Additional Equipment:   Intra-op Plan:   Post-operative Plan: Extubation in OR  Informed Consent: I have reviewed the patients History and Physical, chart, labs and discussed the procedure including the risks, benefits and alternatives for the proposed anesthesia with the patient or authorized representative who has indicated his/her understanding and acceptance.   Dental advisory given  Plan Discussed with: CRNA, Anesthesiologist and Surgeon  Anesthesia Plan Comments:         Anesthesia Quick Evaluation  

## 2011-04-07 NOTE — Op Note (Signed)
OPERATIVE REPORT  DATE OF SURGERY: 04/07/2011  PATIENT:  Benjamin [name],  78 y.o. male  PRE-OPERATIVE DIAGNOSIS:  Osteomyelitis Left Calcaneous  POST-OPERATIVE DIAGNOSIS:  Osteomyelitis Left Calcaneous  PROCEDURE:  Procedure(s): IRRIGATION AND DEBRIDEMENT EXTREMITY. Partial calcaneal excision. Placement of a cell xenograft tissue graft. Placement of antibiotic beads stimulant beads with 1 g of vancomycin and 240 mg gentamicin  SURGEON:  Surgeon(s): Nadara Mustard, MD  ANESTHESIA:   general  EBL:  Minimal ML  SPECIMEN:  No Specimen  TOURNIQUET:  * No tourniquets in log *  PROCEDURE DETAILS: Patient is a 78 year old gentleman who presents for foot salvage surgery. He is undergone prolonged conservative therapy has osteomyelitis of the calcaneus with a chronic ulcer which probes to bone over the calcaneus. Due to failure conservative treatment and wished to proceed with foot salvage surgery patient presents at this time for the above-mentioned procedure. Risks and benefits were discussed including persistent infection neurovascular injury nonhealing of the wound potential for transtibial amputation. Patient states he understands and wishes to proceed at this time. Description of procedure patient was brought to or room 15 and underwent a general anesthetic. After adequate levels of anesthesia were obtained patient's left lower extremity was prepped using DuraPrep and draped into a sterile field. A midline longitudinal incision was made over the calcaneus this ellipsed out the ulcer and the ulcer and soft tissue and sinus track were resected in one block of tissue. There was some retained black material deep within the wound and this had the appearance of an alginate silver dressing which was retained. Patient underwent a partial calcaneal excision with osteotomes. This debrided back to a bleeding viable healthy bone. There is no abscess deep within the wound or bone. A cell tissue that was  applied to the calcaneus as well as a cell powder. The wound was then left open with stimulant beads 1 g vancomycin and 240 mg of gentamicin. The incision was closed with 2-0 nylon. The wound was covered with Adaptic orthopedic sponges 4 x 4 ABDs Kerlix and Coban. Patient was extubated taken to the PACU in stable condition.  PLAN OF CARE: Admit for overnight observation  PATIENT DISPOSITION:  PACU - hemodynamically stable.   Nadara Mustard, MD 04/07/2011 10:50 AM

## 2011-04-07 NOTE — Transfer of Care (Signed)
Immediate Anesthesia Transfer of Care Note  Patient: Benjamin [name]  Procedure(s) Performed: Procedure(s) (LRB): IRRIGATION AND DEBRIDEMENT EXTREMITY (Left)  Patient Location: PACU  Anesthesia Type: General  Level of Consciousness: awake, alert  and oriented  Airway & Oxygen Therapy: Patient Spontanous Breathing and Patient connected to nasal cannula oxygen  Post-op Assessment: Report given to PACU RN, Post -op Vital signs reviewed and stable and Patient moving all extremities X 4  Post vital signs: Reviewed and stable  Complications: No apparent anesthesia complications

## 2011-04-07 NOTE — Addendum Note (Signed)
Addendum  created 04/07/11 1216 by Kerby Nora, MD   Modules edited:Orders

## 2011-04-07 NOTE — Progress Notes (Signed)
Dr Ivin Booty notified that pt drank 6 oz apple juice at 0530 and he said it is OK

## 2011-04-07 NOTE — Anesthesia Postprocedure Evaluation (Signed)
  Anesthesia Post-op Note  Patient: Benjamin [name]  Procedure(s) Performed: Procedure(s) (LRB): IRRIGATION AND DEBRIDEMENT EXTREMITY (Left)  Patient Location: PACU  Anesthesia Type: General  Level of Consciousness: awake, alert  and oriented  Airway and Oxygen Therapy: Patient Spontanous Breathing and Patient connected to nasal cannula oxygen  Post-op Pain: moderate  Post-op Assessment: Post-op Vital signs reviewed, Patient's Cardiovascular Status Stable, Respiratory Function Stable, Patent Airway, No signs of Nausea or vomiting and Pain level controlled  Post-op Vital Signs: Reviewed and stable  Complications: No apparent anesthesia complications

## 2011-04-08 ENCOUNTER — Encounter (HOSPITAL_COMMUNITY): Payer: Self-pay | Admitting: Orthopedic Surgery

## 2011-04-08 LAB — GLUCOSE, CAPILLARY: Glucose-Capillary: 179 mg/dL — ABNORMAL HIGH (ref 70–99)

## 2011-04-08 NOTE — Progress Notes (Signed)
Patient ID: Benjamin [name], male   DOB: 10-13-1933, 78 y.o.   MRN: [id number] Postoperative day 1 partial calcaneal excision placement of xenograft placement of antibiotic beads. Patient is to get up with therapy nonweightbearing on the left lower extremity. The dressing is clean and dry potential discharge to home tomorrow.

## 2011-04-08 NOTE — Progress Notes (Signed)
Physical Therapy Evaluation Note  Past Medical History  Diagnosis Date  . CAD (coronary artery disease)     Severe native, Statuspost coronary bypass grafting 2004 with a I to the LAD and the sac was vein graft obtuse marginal and a  second vein graft to the diagonal. Preserved LV function with an ejection fraction of 55%  . Upper GI bleeding     history of upper GI bleeding status pst EGD and colonoscopy. Anicoagulation with aspirin and plavix  . Iron deficiency anemia     Secondary to GI bleed improved with current therapy  . Diabetes mellitus   . Hypertension   . Obesity   . DJD (degenerative joint disease)   . Internal mammary artery injury     Continued patency of the internal mammary to the LAD, occlusion of the saphenous vein graft to the diagonal, severe multiple high-grade lesions with large clot burder in saphenous vein graft to the OM with small distal target, and high-grade in-stent restenosis  . Atrial fibrillation     History of postoperative atrial fibrillation  . Chronic renal insufficiency     rather than baseline 1.56  . GERD (gastroesophageal reflux disease)   . Peripheral vascular disease     s/p arteriogram by Dr. Excell Seltzer, bilaterally  . Pneumonia   . Hyperlipidemia     Past Surgical History  Procedure Date  . Coronary stents     three  . Lumbar fusions   . Laminectomy   . Anterior cervical discectomy   . Cardiac catheterization     2006  . Coronary artery bypass graft     stents x 3  . I&d extremity 04/07/2011    Procedure: IRRIGATION AND DEBRIDEMENT EXTREMITY;  Surgeon: Nadara Mustard, MD;  Location: [facility] OR;  Service: Orthopedics;  Laterality: Left;  Left Partial Calcaneal Excision, Place Antibiotic Beads     04/08/11 0920  PT Visit Information  Last PT Received On 04/08/11  Precautions  Precautions Fall  Required Braces or Orthoses Yes  Other Brace/Splint post-op shoe when amb  Restrictions  Weight Bearing Restrictions Yes  LLE Weight Bearing NWB   Home Living  Lives With Spouse (18 yo grandson lives and can provide 24/7 assist)  Receives Help From Family (spouse to assist with dressing/bathing)  Type of Home House  Home Layout One level  Home Access Stairs to enter (ramp is being built at this time)  [employer];Tub/shower Financial planner (have a riser)  Bathroom Accessibility Yes  How Accessible Accessible via walker  Home Adaptive Equipment Built-in shower seat;Tub transfer bench;Walker - rolling;Raised toilet seat with rails;Hand-held shower hose;Grab bars in shower;Wheelchair - manual (w/c with elevated leg rest and transport w/c)  Prior Function  Level of Independence Requires assistive device for independence (wife assist with dressing change and bath)  Able to Take Stairs? Yes  Driving Yes  Vocation Retired  Public house manager  Overall Cognitive Status Appears within functional limits for tasks assessed  Orientation Level Oriented X4  Sensation  Light Touch Appears Intact  Bed Mobility  Bed Mobility Yes  Supine to Sit 4: Min assist;With rails;HOB flat  Supine to Sit Details (indicate cue type and reason) increased time, labored effort  Transfers  Sit to Stand 3: Mod assist;With upper extremity assist;From bed  Sit to Stand Details (indicate cue type and reason) v/c's for hand placement, v/c's for L LE NWB, PT to assist R foot from slipping on floor  Stand Pivot Transfers 3: Mod assist  Stand Pivot Transfer Details (indicate cue type and reason) used RW, assist for walker management, pt inconsistent with maintaining L LE NWB despite max v/c's. Patient c/o nausea. Pt with difficulty hopping on R LE.  Ambulation/Gait  Ambulation/Gait No  Stairs No  Engineering geologist No  Balance  Balance Assessed Yes  Dynamic Standing Balance  Dynamic Standing - Balance Support Bilateral upper extremity supported  Dynamic Standing - Level of  Assistance 4: Min assist  Dynamic Standing - Comments pt requires bilat UE support to maintain balance  RUE Assessment  RUE Assessment WFL  LUE Assessment  LUE Assessment WFL  RLE Assessment  RLE Assessment WFL  LLE Assessment  LLE Assessment (grossly 3/5 except ankle not tested due to calcaneal excisio)  Cervical Assessment  Cervical Assessment [facility]  Thoracic Assessment  Thoracic Assessment WFL  Lumbar Assessment  Lumbar Assessment WFL  PT - End of Session  Equipment Utilized During Treatment Gait belt (L post-op shoe)  Activity Tolerance (limited by nausea)  Patient left in chair;with call bell in reach;with family/visitor present (pt nauseated, RN alerted)  Nurse Communication Mobility status for transfers  General  Behavior During Session [facility] for tasks performed  Cognition [facility] for tasks performed  PT Assessment  Clinical Impression Statement Patient s/p L calcaneal excision presenting with L LE NWB and to wear L post-op shoe when OOB. Patient limited by nausea and has increased difficulty "hopping on R LE" however pt spouse reports having manual w/c with elevating leg rests. Patient to benefit from skilled PT while in hospital to maximize transfer and ambulation ability for safe transition home.  PT Recommendation/Assessment Patient will need skilled PT in the acute care venue  PT Problem List Decreased strength;Decreased range of motion;Decreased activity tolerance;Decreased balance;Decreased mobility;Decreased knowledge of use of DME  PT Therapy Diagnosis  Difficulty walking;Abnormality of gait;Generalized weakness  PT Plan  PT Frequency Min 5X/week  PT Treatment/Interventions DME instruction;Gait training;Stair training;Functional mobility training;Therapeutic activities;Therapeutic exercise  PT Recommendation  Follow Up Recommendations Supervision/Assistance - 24 hour (will need outpatient PT when cleared by MD)  Equipment Recommended None recommended by PT (spouse  reports having recommended DME)  Individuals Consulted  Consulted and Agree with Results and Recommendations Patient;Family member/caregiver  Family Member Consulted spouse  Acute Rehab PT Goals  PT Goal Formulation With patient  Time For Goal Achievement 7 days  Pt will go Supine/Side to Sit with modified independence;with HOB 0 degrees  PT Goal: Supine/Side to Sit - Progress Goal set today  Pt will go Sit to Stand with supervision (up to RW)  PT Goal: Sit to Stand - Progress Goal set today  Pt will Transfer Bed to Chair/Chair to Bed with supervision (with RW and L LE NWB)  PT Transfer Goal: Bed to Chair/Chair to Bed - Progress Goal set today  Pt will Ambulate 51 - 150 feet;with supervision;with rolling walker (with L LE NWB.)  PT Goal: Ambulate - Progress Goal set today  Pt will Perform Home Exercise Program Independently  PT Goal: Perform Home Exercise Program - Progress Goal set today    Pain: 8/10 L foot pain  Lewis Shock, PT, DPT Pager #: [phone] Office #: [phone]

## 2011-04-09 LAB — GLUCOSE, CAPILLARY

## 2011-04-09 MED ORDER — HYDROCODONE-ACETAMINOPHEN 5-325 MG PO TABS: 1.0000 | ORAL_TABLET | ORAL | Status: AC | PRN

## 2011-04-09 MED ORDER — HYDROCODONE-ACETAMINOPHEN 5-500 MG PO TABS: 1.0000 | ORAL_TABLET | Freq: Four times a day (QID) | ORAL | Status: AC | PRN

## 2011-04-09 MED ORDER — HYDROCODONE-ACETAMINOPHEN 5-325 MG PO TABS
1.0000 | ORAL_TABLET | ORAL | Status: DC | PRN
  Administered 2011-04-09: 2 via ORAL
  Filled 2011-04-09: qty 2

## 2011-04-09 NOTE — Progress Notes (Signed)
Utilization review completed. Khiry Pasquariello, RN, BSN. 04/09/11  

## 2011-04-09 NOTE — Progress Notes (Signed)
Physical Therapy Treatment Note   04/09/11 1115  PT Visit Information  Last PT Received On 04/09/11  Precautions  Precautions Fall  Other Brace/Splint post op shoe when amb  Restrictions  LLE Weight Bearing NWB  Bed Mobility  Supine to Sit 6: Modified independent (Device/Increase time)  Transfers  Sit to Stand 4: Min assist  Sit to Stand Details (indicate cue type and reason) v/c's for hand placement  Ambulation/Gait  Ambulation/Gait Yes  Ambulation/Gait Assistance 4: Min assist (contact guard)  Ambulation/Gait Assistance Details (indicate cue type and reason) pt 100% compliant with L LE NWB  Ambulation Distance (Feet) 20 Feet  Assistive device Rolling walker  Gait Pattern Step-to pattern  Gait velocity slow  Stairs No  Wheelchair Mobility  Wheelchair Mobility No  Total Joint Exercises: hand out and theraband provided  Long Arc Quad AROM;Left;Seated;10 reps (with green theraband)  Knee Flexion AROM;Left;10 reps;Seated (with green theraband)  Straight Leg Raises AROM;Left;10 reps;Supine  Hip ABduction/ADduction AROM;Left;Supine;10 reps  PT - End of Session  Equipment Utilized During Treatment Gait belt (L post op shoe)  Activity Tolerance Patient tolerated treatment well (pt with no nausea)  Patient left in chair;with call bell in reach;with family/visitor present  General  Behavior During Session [facility] for tasks performed  Cognition [facility] for tasks performed  PT - Assessment/Plan  Comments on Treatment Session Patient reports pain medication to have changed and now he no longer experiences nausea. Patient demonstrated safe transferas and ambulation while maintaining L LE NWB and is safe to return home with spouse.  PT Plan Discharge plan remains appropriate  PT Frequency Min 5X/week  Follow Up Recommendations No PT follow up;Supervision/Assistance - 24 hour  Equipment Recommended None recommended by PT  Acute Rehab PT Goals  PT Goal Formulation With patient  PT Goal:  Supine/Side to Sit - Progress Progressing toward goal  PT Goal: Sit to Stand - Progress Progressing toward goal  PT Transfer Goal: Bed to Chair/Chair to Bed - Progress Progressing toward goal  PT Goal: Ambulate - Progress Progressing toward goal  PT Goal: Perform Home Exercise Program - Progress Progressing toward goal    Pain: 3/10 L calcaneus  Lewis Shock, PT, DPT Pager #: [phone] Office #: [phone]

## 2011-04-09 NOTE — Discharge Summary (Signed)
Physician Discharge Summary  Patient ID: Benjamin [name] MRN: [id number] DOB/AGE: Oct 10, 1933 78 y.o.  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  Discharge Diagnoses: Same Active Problems:  * No active hospital problems. *    Discharged Condition: stable  Hospital Course: Patient's hospital course was essentially unremarkable. He underwent excision of the osteomyelitis of the left calcaneus placement of xenograft tissue graft placement of antibiotic beads and closure of the ulceration. Postoperatively patient progressed well with therapy and was discharged to home in stable condition.  Consults: None  Significant Diagnostic Studies: labs: Routine labs  Treatments: surgery: Please see operative note  Discharge Exam: Blood pressure 142/54, pulse 53, temperature 98.2 F (36.8 C), temperature source Oral, resp. rate 18, height 6\' 2"  (1.88 m), weight 112.492 kg (248 lb), SpO2 97.00%. Incision/Wound: dressing clean and dry at time of discharge.  Disposition: 01-Home or Self Care  Discharge Orders    Future Orders Please Complete By Expires   Diet - low sodium heart healthy      Call MD / Call 911      Comments:   If you experience chest pain or shortness of breath, CALL 911 and be transported to the hospital emergency room.  If you develope a fever above 101 F, pus (white drainage) or increased drainage or redness at the wound, or calf pain, call your surgeon's office.   Constipation Prevention      Comments:   Drink plenty of fluids.  Prune juice may be helpful.  You may use a stool softener, such as Colace (over the counter) 100 mg twice a day.  Use MiraLax (over the counter) for constipation as needed.   Increase activity slowly as tolerated      Weight Bearing as taught in Physical Therapy      Comments:   Use a walker or crutches as instructed.     Medication List  As of 04/09/2011   7:19 AM   TAKE these medications         amLODipine 5 MG tablet   Commonly known as: NORVASC   Take 5 mg by mouth daily.      aspirin 81 MG tablet   Take 81 mg by mouth daily.      carvedilol 3.125 MG tablet   Commonly known as: COREG   Take 3.125 mg by mouth 2 (two) times daily.      ciprofloxacin 500 MG tablet   Commonly known as: CIPRO   Take 500 mg by mouth daily. Maintenance      clopidogrel 75 MG tablet   Commonly known as: PLAVIX   Take 1 tablet (75 mg total) by mouth daily.      doxazosin 4 MG tablet   Commonly known as: CARDURA   Take 4 mg by mouth 2 (two) times daily.      ferrous sulfate 325 (65 FE) MG tablet   Take 1 tablet (325 mg total) by mouth 3 (three) times daily with meals.      glipiZIDE 10 MG 24 hr tablet   Commonly known as: GLUCOTROL XL   Take 10 mg by mouth daily.      hydrochlorothiazide 25 MG tablet   Commonly known as: HYDRODIURIL   Take 25 mg by mouth daily.      HYDROcodone-acetaminophen 5-500 MG per tablet   Commonly known as: VICODIN   Take 1 tablet by mouth every 6 (six) hours as needed  for pain.      isosorbide mononitrate 30 MG 24 hr tablet   Commonly known as: IMDUR   Take 30 mg by mouth daily.      metFORMIN 500 MG tablet   Commonly known as: GLUCOPHAGE   Take 500 mg by mouth 2 (two) times daily as needed. For diabetes      mulitivitamin with minerals Tabs   Take 1 tablet by mouth daily.      multivitamin 3-35-2 MG Tabs tablet   Take 1 tablet by mouth daily.      nitroGLYCERIN 0.4 MG SL tablet   Commonly known as: NITROSTAT   Place 0.4 mg under the tongue every 5 (five) minutes as needed. For chest pain      pantoprazole 40 MG tablet   Commonly known as: PROTONIX   Take 40 mg by mouth daily.      pravastatin 80 MG tablet   Commonly known as: PRAVACHOL   Take 80 mg by mouth daily.      traMADol 50 MG tablet   Commonly known as: ULTRAM   Take 50 mg by mouth every 6 (six) hours as needed. For pain              Follow-up Information    Follow up with Willoughby Doell V, MD in 1 week.   Contact information:   [address] [place] [address] [phone]          Signed: Nadara Mustard 04/09/2011, 7:19 AM

## 2011-04-09 NOTE — Discharge Instructions (Signed)
Keep dressing left foot dry keep leg elevated and nonweightbearing on the left foot.

## 2011-04-23 ENCOUNTER — Other Ambulatory Visit: Payer: Self-pay | Admitting: Cardiology

## 2011-06-18 ENCOUNTER — Other Ambulatory Visit: Payer: Self-pay | Admitting: Cardiology

## 2011-07-20 ENCOUNTER — Other Ambulatory Visit: Payer: Self-pay | Admitting: Cardiology

## 2011-07-24 ENCOUNTER — Other Ambulatory Visit: Payer: Self-pay | Admitting: Cardiology

## 2011-08-04 ENCOUNTER — Other Ambulatory Visit (HOSPITAL_COMMUNITY): Payer: Self-pay | Admitting: Orthopedic Surgery

## 2011-08-05 ENCOUNTER — Other Ambulatory Visit (HOSPITAL_COMMUNITY): Payer: Self-pay | Admitting: Orthopedic Surgery

## 2011-08-05 ENCOUNTER — Encounter (HOSPITAL_COMMUNITY): Payer: Self-pay | Admitting: Pharmacy Technician

## 2011-08-10 NOTE — Progress Notes (Signed)
Call to pt. For preop screening, he remarks that he should have stopped Plavix sooner, last dose taken on Sunday7/28/2013. Pt. Assured that Dr. Lajoyce Corners will be called & will review with him.  Call to Dr. Lajoyce Corners- reviewed pt.'s schedule of plavix.  Dr. Lajoyce Corners ok's pt.'s handling of Plavix- last dose was taken 08/08/2011

## 2011-08-11 MED ORDER — CLINDAMYCIN PHOSPHATE 900 MG/50ML IV SOLN
900.0000 mg | INTRAVENOUS | Status: AC
  Administered 2011-08-12: 900 mg via INTRAVENOUS
  Filled 2011-08-11: qty 50

## 2011-08-12 ENCOUNTER — Encounter (HOSPITAL_COMMUNITY): Payer: Self-pay | Admitting: *Deleted

## 2011-08-12 ENCOUNTER — Ambulatory Visit (HOSPITAL_COMMUNITY): Payer: PRIVATE HEALTH INSURANCE | Admitting: Anesthesiology

## 2011-08-12 ENCOUNTER — Encounter (HOSPITAL_COMMUNITY)
Admission: RE | Disposition: A | Discharge status: Skilled Nursing Facility | Payer: Self-pay | Source: Ambulatory Visit | Attending: Orthopedic Surgery

## 2011-08-12 ENCOUNTER — Encounter (HOSPITAL_COMMUNITY): Payer: Self-pay | Admitting: Anesthesiology

## 2011-08-12 ENCOUNTER — Inpatient Hospital Stay (HOSPITAL_COMMUNITY)
Admission: RE | Admit: 2011-08-12 | Discharge: 2011-08-16 | DRG: 617 | Disposition: A | Discharge status: Skilled Nursing Facility | Payer: PRIVATE HEALTH INSURANCE | Source: Ambulatory Visit | Attending: Orthopedic Surgery | Admitting: Orthopedic Surgery

## 2011-08-12 ENCOUNTER — Ambulatory Visit (HOSPITAL_COMMUNITY): Payer: PRIVATE HEALTH INSURANCE

## 2011-08-12 DIAGNOSIS — M1990 Unspecified osteoarthritis, unspecified site: Secondary | ICD-10-CM | POA: Diagnosis present

## 2011-08-12 DIAGNOSIS — M86179 Other acute osteomyelitis, unspecified ankle and foot: Secondary | ICD-10-CM

## 2011-08-12 DIAGNOSIS — L97409 Non-pressure chronic ulcer of unspecified heel and midfoot with unspecified severity: Secondary | ICD-10-CM | POA: Diagnosis present

## 2011-08-12 DIAGNOSIS — I129 Hypertensive chronic kidney disease with stage 1 through stage 4 chronic kidney disease, or unspecified chronic kidney disease: Secondary | ICD-10-CM | POA: Diagnosis present

## 2011-08-12 DIAGNOSIS — Z79899 Other long term (current) drug therapy: Secondary | ICD-10-CM

## 2011-08-12 DIAGNOSIS — E875 Hyperkalemia: Secondary | ICD-10-CM | POA: Diagnosis present

## 2011-08-12 DIAGNOSIS — E785 Hyperlipidemia, unspecified: Secondary | ICD-10-CM | POA: Diagnosis present

## 2011-08-12 DIAGNOSIS — I4891 Unspecified atrial fibrillation: Secondary | ICD-10-CM | POA: Diagnosis present

## 2011-08-12 DIAGNOSIS — E1149 Type 2 diabetes mellitus with other diabetic neurological complication: Secondary | ICD-10-CM | POA: Diagnosis present

## 2011-08-12 DIAGNOSIS — Z951 Presence of aortocoronary bypass graft: Secondary | ICD-10-CM

## 2011-08-12 DIAGNOSIS — E1169 Type 2 diabetes mellitus with other specified complication: Principal | ICD-10-CM | POA: Diagnosis present

## 2011-08-12 DIAGNOSIS — M9080 Osteopathy in diseases classified elsewhere, unspecified site: Secondary | ICD-10-CM | POA: Diagnosis present

## 2011-08-12 DIAGNOSIS — N189 Chronic kidney disease, unspecified: Secondary | ICD-10-CM | POA: Diagnosis present

## 2011-08-12 DIAGNOSIS — E1142 Type 2 diabetes mellitus with diabetic polyneuropathy: Secondary | ICD-10-CM | POA: Diagnosis present

## 2011-08-12 DIAGNOSIS — Z7982 Long term (current) use of aspirin: Secondary | ICD-10-CM

## 2011-08-12 DIAGNOSIS — K219 Gastro-esophageal reflux disease without esophagitis: Secondary | ICD-10-CM | POA: Diagnosis present

## 2011-08-12 DIAGNOSIS — I739 Peripheral vascular disease, unspecified: Secondary | ICD-10-CM | POA: Diagnosis present

## 2011-08-12 DIAGNOSIS — I2510 Atherosclerotic heart disease of native coronary artery without angina pectoris: Secondary | ICD-10-CM | POA: Diagnosis present

## 2011-08-12 DIAGNOSIS — M869 Osteomyelitis, unspecified: Secondary | ICD-10-CM | POA: Diagnosis present

## 2011-08-12 HISTORY — PX: AMPUTATION: SHX166

## 2011-08-12 LAB — GLUCOSE, CAPILLARY
Glucose-Capillary: 151 mg/dL — ABNORMAL HIGH (ref 70–99)
Glucose-Capillary: 210 mg/dL — ABNORMAL HIGH (ref 70–99)
Glucose-Capillary: 177 mg/dL — ABNORMAL HIGH (ref 70–99)

## 2011-08-12 LAB — COMPREHENSIVE METABOLIC PANEL
ALT: 19 U/L (ref 0–53)
Albumin: 3.8 g/dL (ref 3.5–5.2)
Alkaline Phosphatase: 57 U/L (ref 39–117)
Glucose, Bld: 160 mg/dL — ABNORMAL HIGH (ref 70–99)
Potassium: 5.4 mEq/L — ABNORMAL HIGH (ref 3.5–5.1)
Sodium: 143 mEq/L (ref 135–145)
Total Protein: 7.3 g/dL (ref 6.0–8.3)

## 2011-08-12 LAB — CBC
Hemoglobin: 12 g/dL — ABNORMAL LOW (ref 13.0–17.0)
MCHC: 33.1 g/dL (ref 30.0–36.0)
RDW: 14.2 % (ref 11.5–15.5)

## 2011-08-12 LAB — POTASSIUM
Potassium: 6 mEq/L — ABNORMAL HIGH (ref 3.5–5.1)
Potassium: 5.2 mEq/L — ABNORMAL HIGH (ref 3.5–5.1)

## 2011-08-12 LAB — APTT: aPTT: 32 seconds (ref 24–37)

## 2011-08-12 LAB — SURGICAL PCR SCREEN: Staphylococcus aureus: NEGATIVE

## 2011-08-12 SURGERY — AMPUTATION BELOW KNEE
Anesthesia: General | Site: Leg Lower | Laterality: Left | Wound class: Clean

## 2011-08-12 MED ORDER — METANX 3-35-2 MG PO TABS: 1.0000 | ORAL_TABLET | Freq: Every day | ORAL | Status: DC

## 2011-08-12 MED ORDER — INSULIN ASPART 100 UNIT/ML ~~LOC~~ SOLN
12.0000 [IU] | Freq: Once | SUBCUTANEOUS | Status: AC
  Administered 2011-08-12: 12 [IU] via SUBCUTANEOUS

## 2011-08-12 MED ORDER — DEXTROSE 50 % IV SOLN
1.0000 | Freq: Once | INTRAVENOUS | Status: AC
  Administered 2011-08-12: 50 mL via INTRAVENOUS
  Filled 2011-08-12: qty 50

## 2011-08-12 MED ORDER — PROMETHAZINE HCL 25 MG/ML IJ SOLN: 6.2500 mg | INTRAMUSCULAR | Status: DC | PRN

## 2011-08-12 MED ORDER — HYDROMORPHONE HCL PF 1 MG/ML IJ SOLN
INTRAMUSCULAR | Status: AC
  Administered 2011-08-12: 1 mg via INTRAVENOUS
  Filled 2011-08-12: qty 1

## 2011-08-12 MED ORDER — METOCLOPRAMIDE HCL 5 MG/ML IJ SOLN: 5.0000 mg | Freq: Three times a day (TID) | INTRAMUSCULAR | Status: DC | PRN

## 2011-08-12 MED ORDER — HYDROMORPHONE HCL PF 1 MG/ML IJ SOLN
0.5000 mg | INTRAMUSCULAR | Status: DC | PRN
  Administered 2011-08-12 (×2): 1 mg via INTRAVENOUS
  Filled 2011-08-12 (×2): qty 1

## 2011-08-12 MED ORDER — LIDOCAINE HCL (CARDIAC) 10 MG/ML IV SOLN
INTRAVENOUS | Status: DC | PRN
  Administered 2011-08-12: 100 mg via INTRAVENOUS

## 2011-08-12 MED ORDER — CLOPIDOGREL BISULFATE 75 MG PO TABS
75.0000 mg | ORAL_TABLET | Freq: Every day | ORAL | Status: DC
  Administered 2011-08-12 – 2011-08-13 (×2): 75 mg via ORAL
  Filled 2011-08-12 (×3): qty 1

## 2011-08-12 MED ORDER — MIDAZOLAM HCL 2 MG/2ML IJ SOLN: 1.0000 mg | INTRAMUSCULAR | Status: DC | PRN

## 2011-08-12 MED ORDER — ASPIRIN EC 81 MG PO TBEC
81.0000 mg | DELAYED_RELEASE_TABLET | Freq: Every day | ORAL | Status: DC
  Administered 2011-08-12 – 2011-08-16 (×5): 81 mg via ORAL
  Filled 2011-08-12 (×6): qty 1

## 2011-08-12 MED ORDER — METHOCARBAMOL 500 MG PO TABS
500.0000 mg | ORAL_TABLET | Freq: Four times a day (QID) | ORAL | Status: DC | PRN
  Administered 2011-08-12 – 2011-08-14 (×5): 500 mg via ORAL
  Filled 2011-08-12 (×4): qty 1

## 2011-08-12 MED ORDER — FENTANYL CITRATE 0.05 MG/ML IJ SOLN
INTRAMUSCULAR | Status: DC | PRN
  Administered 2011-08-12 (×2): 50 ug via INTRAVENOUS

## 2011-08-12 MED ORDER — METHOCARBAMOL 100 MG/ML IJ SOLN
500.0000 mg | Freq: Four times a day (QID) | INTRAVENOUS | Status: DC | PRN
  Filled 2011-08-12: qty 5

## 2011-08-12 MED ORDER — L-METHYLFOLATE-B6-B12 3-35-2 MG PO TABS
1.0000 | ORAL_TABLET | Freq: Every day | ORAL | Status: DC
  Administered 2011-08-12 – 2011-08-16 (×5): 1 via ORAL
  Filled 2011-08-12 (×6): qty 1

## 2011-08-12 MED ORDER — MUPIROCIN 2 % EX OINT
TOPICAL_OINTMENT | CUTANEOUS | Status: AC
  Administered 2011-08-12: 1 via NASAL
  Filled 2011-08-12: qty 22

## 2011-08-12 MED ORDER — SODIUM CHLORIDE 0.9 % IV SOLN
INTRAVENOUS | Status: DC
  Administered 2011-08-12: 14:00:00 via INTRAVENOUS

## 2011-08-12 MED ORDER — HYDROCODONE-ACETAMINOPHEN 5-325 MG PO TABS
1.0000 | ORAL_TABLET | ORAL | Status: DC | PRN
  Administered 2011-08-12 – 2011-08-14 (×7): 2 via ORAL
  Administered 2011-08-14 – 2011-08-16 (×2): 1 via ORAL
  Filled 2011-08-12 (×3): qty 2
  Filled 2011-08-12: qty 1
  Filled 2011-08-12 (×2): qty 2
  Filled 2011-08-12: qty 1
  Filled 2011-08-12: qty 2

## 2011-08-12 MED ORDER — PROPOFOL 10 MG/ML IV EMUL
INTRAVENOUS | Status: DC | PRN
  Administered 2011-08-12: 200 mg via INTRAVENOUS

## 2011-08-12 MED ORDER — METFORMIN HCL 500 MG PO TABS: 1000.0000 mg | ORAL_TABLET | Freq: Two times a day (BID) | ORAL | Status: DC

## 2011-08-12 MED ORDER — METHOCARBAMOL 500 MG PO TABS
ORAL_TABLET | ORAL | Status: AC
  Filled 2011-08-12: qty 1

## 2011-08-12 MED ORDER — DOXAZOSIN MESYLATE 4 MG PO TABS
4.0000 mg | ORAL_TABLET | Freq: Two times a day (BID) | ORAL | Status: DC
  Administered 2011-08-12 – 2011-08-16 (×8): 4 mg via ORAL
  Filled 2011-08-12 (×10): qty 1

## 2011-08-12 MED ORDER — LACTATED RINGERS IV SOLN
INTRAVENOUS | Status: DC | PRN
  Administered 2011-08-12: 11:00:00 via INTRAVENOUS

## 2011-08-12 MED ORDER — ASPIRIN 81 MG PO TABS
81.0000 mg | ORAL_TABLET | Freq: Every day | ORAL | Status: DC
Start: 2011-08-12 — End: 2011-08-12

## 2011-08-12 MED ORDER — SIMVASTATIN 5 MG PO TABS
5.0000 mg | ORAL_TABLET | Freq: Every day | ORAL | Status: DC
  Administered 2011-08-12 – 2011-08-15 (×4): 5 mg via ORAL
  Filled 2011-08-12 (×5): qty 1

## 2011-08-12 MED ORDER — MIDAZOLAM HCL 5 MG/5ML IJ SOLN
INTRAMUSCULAR | Status: DC | PRN
  Administered 2011-08-12: 2 mg via INTRAVENOUS

## 2011-08-12 MED ORDER — HYDROCODONE-ACETAMINOPHEN 5-325 MG PO TABS
ORAL_TABLET | ORAL | Status: AC
  Administered 2011-08-12: 2 via ORAL
  Filled 2011-08-12: qty 2

## 2011-08-12 MED ORDER — HYDROMORPHONE HCL PF 1 MG/ML IJ SOLN
0.2500 mg | INTRAMUSCULAR | Status: DC | PRN
  Administered 2011-08-12 (×3): 0.5 mg via INTRAVENOUS

## 2011-08-12 MED ORDER — HYDROMORPHONE HCL PF 1 MG/ML IJ SOLN
INTRAMUSCULAR | Status: AC
  Filled 2011-08-12: qty 1

## 2011-08-12 MED ORDER — NITROGLYCERIN 0.4 MG SL SUBL: 0.4000 mg | SUBLINGUAL_TABLET | SUBLINGUAL | Status: DC | PRN

## 2011-08-12 MED ORDER — PNEUMOCOCCAL VAC POLYVALENT 25 MCG/0.5ML IJ INJ
0.5000 mL | INJECTION | INTRAMUSCULAR | Status: AC
  Filled 2011-08-12: qty 0.5

## 2011-08-12 MED ORDER — FENTANYL CITRATE 0.05 MG/ML IJ SOLN: 50.0000 ug | INTRAMUSCULAR | Status: DC | PRN

## 2011-08-12 MED ORDER — GLYCOPYRROLATE 0.2 MG/ML IJ SOLN
INTRAMUSCULAR | Status: DC | PRN
  Administered 2011-08-12: 0.2 mg via INTRAVENOUS

## 2011-08-12 MED ORDER — CLINDAMYCIN PHOSPHATE 600 MG/50ML IV SOLN
600.0000 mg | Freq: Four times a day (QID) | INTRAVENOUS | Status: AC
  Administered 2011-08-12 – 2011-08-13 (×3): 600 mg via INTRAVENOUS
  Filled 2011-08-12 (×3): qty 50

## 2011-08-12 MED ORDER — INSULIN ASPART 100 UNIT/ML ~~LOC~~ SOLN: 4.0000 [IU] | Freq: Three times a day (TID) | SUBCUTANEOUS | Status: DC

## 2011-08-12 MED ORDER — FERROUS SULFATE 325 (65 FE) MG PO TABS
325.0000 mg | ORAL_TABLET | Freq: Three times a day (TID) | ORAL | Status: DC
  Administered 2011-08-12 – 2011-08-16 (×11): 325 mg via ORAL
  Filled 2011-08-12 (×14): qty 1

## 2011-08-12 MED ORDER — GLIPIZIDE ER 10 MG PO TB24
10.0000 mg | ORAL_TABLET | Freq: Every day | ORAL | Status: DC
  Administered 2011-08-12 – 2011-08-14 (×3): 10 mg via ORAL
  Filled 2011-08-12 (×5): qty 1

## 2011-08-12 MED ORDER — HYDROCHLOROTHIAZIDE 25 MG PO TABS
25.0000 mg | ORAL_TABLET | Freq: Every day | ORAL | Status: DC
  Administered 2011-08-12 – 2011-08-16 (×5): 25 mg via ORAL
  Filled 2011-08-12 (×5): qty 1

## 2011-08-12 MED ORDER — ISOSORBIDE MONONITRATE ER 30 MG PO TB24
30.0000 mg | ORAL_TABLET | Freq: Every day | ORAL | Status: DC
  Administered 2011-08-13 – 2011-08-16 (×4): 30 mg via ORAL
  Filled 2011-08-12 (×5): qty 1

## 2011-08-12 MED ORDER — GABAPENTIN 300 MG PO CAPS
300.0000 mg | ORAL_CAPSULE | Freq: Three times a day (TID) | ORAL | Status: DC
  Administered 2011-08-12 – 2011-08-14 (×6): 300 mg via ORAL
  Filled 2011-08-12 (×8): qty 1

## 2011-08-12 MED ORDER — METFORMIN HCL 500 MG PO TABS
500.0000 mg | ORAL_TABLET | Freq: Two times a day (BID) | ORAL | Status: DC
  Filled 2011-08-12 (×2): qty 1

## 2011-08-12 MED ORDER — LACTATED RINGERS IV SOLN
INTRAVENOUS | Status: DC
  Administered 2011-08-12: 10:00:00 via INTRAVENOUS

## 2011-08-12 MED ORDER — AMLODIPINE BESYLATE 5 MG PO TABS
5.0000 mg | ORAL_TABLET | Freq: Every day | ORAL | Status: DC
  Administered 2011-08-13 – 2011-08-16 (×4): 5 mg via ORAL
  Filled 2011-08-12 (×5): qty 1

## 2011-08-12 MED ORDER — METOCLOPRAMIDE HCL 10 MG PO TABS: 5.0000 mg | ORAL_TABLET | Freq: Three times a day (TID) | ORAL | Status: DC | PRN

## 2011-08-12 MED ORDER — EPHEDRINE SULFATE 50 MG/ML IJ SOLN
INTRAMUSCULAR | Status: DC | PRN
  Administered 2011-08-12: 15 mg via INTRAVENOUS

## 2011-08-12 MED ORDER — PANTOPRAZOLE SODIUM 40 MG PO TBEC
40.0000 mg | DELAYED_RELEASE_TABLET | Freq: Every day | ORAL | Status: DC
  Administered 2011-08-12 – 2011-08-15 (×4): 40 mg via ORAL
  Filled 2011-08-12 (×4): qty 1

## 2011-08-12 MED ORDER — CARVEDILOL 3.125 MG PO TABS
3.1250 mg | ORAL_TABLET | Freq: Two times a day (BID) | ORAL | Status: DC
  Administered 2011-08-12 – 2011-08-16 (×8): 3.125 mg via ORAL
  Filled 2011-08-12 (×10): qty 1

## 2011-08-12 MED ORDER — DEXTROSE 50 % IV SOLN
INTRAVENOUS | Status: AC
  Administered 2011-08-12: 50 mL via INTRAVENOUS
  Filled 2011-08-12: qty 50

## 2011-08-12 MED ORDER — ONDANSETRON HCL 4 MG/2ML IJ SOLN
INTRAMUSCULAR | Status: DC | PRN
  Administered 2011-08-12: 4 mg via INTRAVENOUS

## 2011-08-12 MED ORDER — INSULIN ASPART 100 UNIT/ML ~~LOC~~ SOLN: 0.0000 [IU] | Freq: Three times a day (TID) | SUBCUTANEOUS | Status: DC

## 2011-08-12 MED ORDER — ONDANSETRON HCL 4 MG PO TABS: 4.0000 mg | ORAL_TABLET | Freq: Four times a day (QID) | ORAL | Status: DC | PRN

## 2011-08-12 MED ORDER — ONDANSETRON HCL 4 MG/2ML IJ SOLN
4.0000 mg | Freq: Four times a day (QID) | INTRAMUSCULAR | Status: DC | PRN
  Administered 2011-08-12: 4 mg via INTRAVENOUS
  Filled 2011-08-12: qty 2

## 2011-08-12 MED ORDER — FUROSEMIDE 20 MG PO TABS
20.0000 mg | ORAL_TABLET | Freq: Every day | ORAL | Status: DC
  Administered 2011-08-12 – 2011-08-16 (×5): 20 mg via ORAL
  Filled 2011-08-12 (×6): qty 1

## 2011-08-12 SURGICAL SUPPLY — 45 items
BANDAGE ESMARK 6X9 LF (GAUZE/BANDAGES/DRESSINGS) ×1 IMPLANT
BANDAGE GAUZE ELAST BULKY 4 IN (GAUZE/BANDAGES/DRESSINGS) ×3 IMPLANT
BLADE SAW RECIP 87.9 MT (BLADE) ×2 IMPLANT
BLADE SURG 21 STRL SS (BLADE) ×2 IMPLANT
BNDG CMPR 9X6 STRL LF SNTH (GAUZE/BANDAGES/DRESSINGS) ×1
BNDG COHESIVE 6X5 TAN STRL LF (GAUZE/BANDAGES/DRESSINGS) ×3 IMPLANT
BNDG ESMARK 6X9 LF (GAUZE/BANDAGES/DRESSINGS) ×2
CLOTH BEACON ORANGE TIMEOUT ST (SAFETY) ×2 IMPLANT
COVER SURGICAL LIGHT HANDLE (MISCELLANEOUS) ×2 IMPLANT
CUFF TOURNIQUET SINGLE 34IN LL (TOURNIQUET CUFF) IMPLANT
CUFF TOURNIQUET SINGLE 44IN (TOURNIQUET CUFF) IMPLANT
DRAIN PENROSE 1/2X12 LTX STRL (WOUND CARE) IMPLANT
DRAPE EXTREMITY T 121X128X90 (DRAPE) ×2 IMPLANT
DRAPE PROXIMA HALF (DRAPES) ×4 IMPLANT
DRAPE U-SHAPE 47X51 STRL (DRAPES) ×4 IMPLANT
DRSG ADAPTIC 3X8 NADH LF (GAUZE/BANDAGES/DRESSINGS) ×2 IMPLANT
DRSG PAD ABDOMINAL 8X10 ST (GAUZE/BANDAGES/DRESSINGS) ×2 IMPLANT
DURAPREP 26ML APPLICATOR (WOUND CARE) ×2 IMPLANT
ELECT REM PT RETURN 9FT ADLT (ELECTROSURGICAL) ×2
ELECTRODE REM PT RTRN 9FT ADLT (ELECTROSURGICAL) ×1 IMPLANT
EVACUATOR 1/8 PVC DRAIN (DRAIN) IMPLANT
GLOVE BIOGEL PI IND STRL 9 (GLOVE) ×1 IMPLANT
GLOVE BIOGEL PI INDICATOR 9 (GLOVE) ×1
GLOVE SURG ORTHO 9.0 STRL STRW (GLOVE) ×2 IMPLANT
GOWN PREVENTION PLUS XLARGE (GOWN DISPOSABLE) ×2 IMPLANT
GOWN SRG XL XLNG 56XLVL 4 (GOWN DISPOSABLE) ×1 IMPLANT
GOWN STRL NON-REIN XL XLG LVL4 (GOWN DISPOSABLE) ×2
KIT BASIN OR (CUSTOM PROCEDURE TRAY) ×2 IMPLANT
KIT ROOM TURNOVER OR (KITS) ×2 IMPLANT
MANIFOLD NEPTUNE II (INSTRUMENTS) ×2 IMPLANT
NS IRRIG 1000ML POUR BTL (IV SOLUTION) ×2 IMPLANT
PACK GENERAL/GYN (CUSTOM PROCEDURE TRAY) ×2 IMPLANT
PAD ARMBOARD 7.5X6 YLW CONV (MISCELLANEOUS) ×4 IMPLANT
SPONGE GAUZE 4X4 12PLY (GAUZE/BANDAGES/DRESSINGS) ×2 IMPLANT
SPONGE LAP 18X18 X RAY DECT (DISPOSABLE) IMPLANT
STAPLER VISISTAT 35W (STAPLE) IMPLANT
STOCKINETTE IMPERVIOUS LG (DRAPES) ×2 IMPLANT
SUT PDS AB 1 CT  36 (SUTURE)
SUT PDS AB 1 CT 36 (SUTURE) IMPLANT
SUT SILK 2 0 (SUTURE) ×2
SUT SILK 2-0 18XBRD TIE 12 (SUTURE) ×1 IMPLANT
TOWEL OR 17X24 6PK STRL BLUE (TOWEL DISPOSABLE) ×2 IMPLANT
TOWEL OR 17X26 10 PK STRL BLUE (TOWEL DISPOSABLE) ×2 IMPLANT
TUBE ANAEROBIC SPECIMEN COL (MISCELLANEOUS) IMPLANT
WATER STERILE IRR 1000ML POUR (IV SOLUTION) ×2 IMPLANT

## 2011-08-12 NOTE — Progress Notes (Signed)
Chaplain Note:  Chaplain visited with pt and pt's family.  Pt was awake, resting in bed.  Family was gathered at bedside.   Chaplain provided spiritual comfort, support, and prayer for pt and family.  At pt's request, chaplain provided a Bible. Pt and family expressed appreciation for chaplain support.  Chaplain will follow up as needed.  08/12/11 1500  Clinical Encounter Type  Visited With Patient and family together  Visit Type Spiritual support  Referral From Nurse  Spiritual Encounters  Spiritual Needs Emotional;Prayer;[facility] text  Stress Factors  Patient Stress Factors Health changes  Family Stress Factors Major life changes   Verdie Shire, chaplain resident [phone]

## 2011-08-12 NOTE — H&P (Signed)
Benjamin [name] is an 78 y.o. male.   Chief Complaint: Osteomyelitis abscess draining ulcer left calcaneus HPI: Patient is a 76 she'll gentleman diabetes insensate neuropathy status post foot salvage with partial calcaneal excision. Patient has developed a recurrent osteomyelitis with a draining sinus tract.  Past Medical History  Diagnosis Date  . CAD (coronary artery disease)     Severe native, Statuspost coronary bypass grafting 2004 with a I to the LAD and the sac was vein graft obtuse marginal and a  second vein graft to the diagonal. Preserved LV function with an ejection fraction of 55%  . Upper GI bleeding     history of upper GI bleeding status pst EGD and colonoscopy. Anicoagulation with aspirin and plavix  . Iron deficiency anemia     Secondary to GI bleed improved with current therapy  . Diabetes mellitus   . Hypertension   . Obesity   . DJD (degenerative joint disease)   . Internal mammary artery injury     Continued patency of the internal mammary to the LAD, occlusion of the saphenous vein graft to the diagonal, severe multiple high-grade lesions with large clot burder in saphenous vein graft to the OM with small distal target, and high-grade in-stent restenosis  . Atrial fibrillation     History of postoperative atrial fibrillation  . Chronic renal insufficiency     rather than baseline 1.56  . GERD (gastroesophageal reflux disease)   . Peripheral vascular disease     s/p arteriogram by Dr. Excell Seltzer, bilaterally  . Hyperlipidemia   . Pneumonia     history 2003    Past Surgical History  Procedure Date  . Coronary stents     three  . Lumbar fusions   . Laminectomy   . Anterior cervical discectomy   . Cardiac catheterization     2006  . Coronary artery bypass graft     stents x 3  . I&d extremity 04/07/2011    Procedure: IRRIGATION AND DEBRIDEMENT EXTREMITY;  Surgeon: Nadara Mustard, MD;  Location: [facility] OR;  Service: Orthopedics;  Laterality: Left;  Left Partial  Calcaneal Excision, Place Antibiotic Beads    Family History  Problem Relation Age of Onset  . Heart disease Father   . Coronary artery disease Mother   . Coronary artery disease Brother    Social History:  reports that he has never smoked. He has never used smokeless tobacco. He reports that he does not drink alcohol or use illicit drugs.  Allergies:  Allergies  Allergen Reactions  . Penicillins     REACTION: rash  . Percocet (Oxycodone-Acetaminophen) Nausea And Vomiting    Medications Prior to Admission  Medication Sig Dispense Refill  . amLODipine (NORVASC) 5 MG tablet Take 5 mg by mouth daily.      Marland Kitchen aspirin 81 MG tablet Take 81 mg by mouth daily.        . carvedilol (COREG) 3.125 MG tablet Take 3.125 mg by mouth 2 (two) times daily.      . clopidogrel (PLAVIX) 75 MG tablet Take 75 mg by mouth daily.      Marland Kitchen doxazosin (CARDURA) 4 MG tablet Take 4 mg by mouth 2 (two) times daily.      Marland Kitchen doxycycline (VIBRAMYCIN) 100 MG capsule Take 100 mg by mouth 2 (two) times daily.      . ferrous sulfate 325 (65 FE) MG tablet Take 1 tablet (325 mg total) by mouth 3 (three) times daily with meals.      Marland Kitchen  gabapentin (NEURONTIN) 300 MG capsule Take 300 mg by mouth 3 (three) times daily.      Marland Kitchen glipiZIDE (GLUCOTROL XL) 10 MG 24 hr tablet Take 10 mg by mouth daily.      . hydrochlorothiazide (HYDRODIURIL) 25 MG tablet Take 25 mg by mouth daily.      Marland Kitchen ibuprofen (ADVIL,MOTRIN) 200 MG tablet Take 400 mg by mouth every 6 (six) hours as needed.      . isosorbide mononitrate (IMDUR) 30 MG 24 hr tablet Take 30 mg by mouth daily with breakfast.       . L-Methylfolate-B6-B12 (METANX) 3-35-2 MG TABS Take 1 tablet by mouth daily.        . metFORMIN (GLUCOPHAGE) 500 MG tablet Take 500 mg by mouth 2 (two) times daily as needed. For diabetes. If cbg is greater than 150.      . Multiple Vitamin (MULITIVITAMIN WITH MINERALS) TABS Take 1 tablet by mouth daily.      . pantoprazole (PROTONIX) 40 MG tablet Take 40 mg  by mouth at bedtime.       . pravastatin (PRAVACHOL) 80 MG tablet Take 80 mg by mouth at bedtime.       . traMADol (ULTRAM) 50 MG tablet Take 50 mg by mouth every 6 (six) hours as needed. For pain      . nitroGLYCERIN (NITROSTAT) 0.4 MG SL tablet Place 0.4 mg under the tongue every 5 (five) minutes as needed. For chest pain        Results for orders placed during the hospital encounter of 08/12/11 (from the past 48 hour(s))  APTT     Status: Normal   Collection Time   08/12/11  8:50 AM      Component Value Range Comment   aPTT 32  24 - 37 seconds   CBC     Status: Abnormal   Collection Time   08/12/11  8:50 AM      Component Value Range Comment   WBC 5.9  4.0 - 10.5 K/uL    RBC 4.40  4.22 - 5.81 MIL/uL    Hemoglobin 12.0 (*) 13.0 - 17.0 g/dL    HCT 11.9 (*) 14.7 - 52.0 %    MCV 82.3  78.0 - 100.0 fL    MCH 27.3  26.0 - 34.0 pg    MCHC 33.1  30.0 - 36.0 g/dL    RDW 82.9  56.2 - 13.0 %    Platelets 141 (*) 150 - 400 K/uL   PROTIME-INR     Status: Normal   Collection Time   08/12/11  8:50 AM      Component Value Range Comment   Prothrombin Time 13.7  11.6 - 15.2 seconds    INR 1.03  0.00 - 1.49   GLUCOSE, CAPILLARY     Status: Abnormal   Collection Time   08/12/11  9:07 AM      Component Value Range Comment   Glucose-Capillary 151 (*) 70 - 99 mg/dL    Dg Chest 2 View  08/17/5782  *RADIOLOGY REPORT*  Clinical Data: Left BKA, coronary artery disease, diabetes, hypertension  CHEST - 2 VIEW  Comparison: July 01, 2010  Findings: Cardiomegaly is unchanged.  There is pulmonary vascular engorgement without frank edema.  The mediastinum is within normal limits.  Both lungs are clear.  The right CP angle is cut off the film.  The inferior most sternotomy wire is fractured, unchanged. Hypertrophic degenerative changes are present within the thoracic spine.  IMPRESSION: Cardiomegaly and pulmonary vascular engorgement without frank edema, unchanged.  Original Report Authenticated By: Brandon Melnick,  M.D.    Review of Systems  All other systems reviewed and are negative.    Blood pressure 198/81, pulse 50, temperature 97.9 F (36.6 C), temperature source Oral, resp. rate 18, SpO2 96.00%. Physical Exam  On examination patient has diminished pulses. He has an ulcer which probes down to the calcaneus plantar aspect of left foot. There is purulent drainage despite antibiotics wound care treatment the draining ulcer and osteotomize the calcaneus has not resolved.  Assessment/Plan Assessment: Osteomyelitis and Wagner grade 3 ulcer with abscess left calcaneus.  Plan: Due to failure of foot salvage surgery patient presents at this time for a left transtibial amputation. Risks and benefits were discussed patient states he understands and wished to proceed at this time.  DUDA,MARCUS V 08/12/2011, 9:26 AM

## 2011-08-12 NOTE — Progress Notes (Signed)
cbg 150. 

## 2011-08-12 NOTE — Progress Notes (Signed)
UR COMPLETED  

## 2011-08-12 NOTE — Anesthesia Postprocedure Evaluation (Signed)
  Anesthesia Post-op Note  Patient: Benjamin [name]  Procedure(s) Performed: Procedure(s) (LRB): AMPUTATION BELOW KNEE (Left)  Patient Location: PACU  Anesthesia Type: General  Level of Consciousness: awake  Airway and Oxygen Therapy: Patient Spontanous Breathing  Post-op Pain: mild  Post-op Assessment: Post-op Vital signs reviewed, Patient's Cardiovascular Status Stable, Respiratory Function Stable, Patent Airway, No signs of Nausea or vomiting and Pain level controlled  Post-op Vital Signs: stable  Complications: No apparent anesthesia complications

## 2011-08-12 NOTE — Op Note (Signed)
OPERATIVE REPORT  DATE OF SURGERY: 08/12/2011  PATIENT:  Benjamin [name],  78 y.o. male  PRE-OPERATIVE DIAGNOSIS:  Osteomyelitis Left Calcaneous  POST-OPERATIVE DIAGNOSIS:  Osteomyelitis Left Calcaneous  PROCEDURE:  Procedure(s): AMPUTATION BELOW KNEE left  SURGEON:  Surgeon(s): Nadara Mustard, MD  ANESTHESIA:   general  EBL:  Minimal ML  SPECIMEN:  Source of Specimen:  Left leg  TOURNIQUET:  * Missing tourniquet times found for documented tourniquets in log:  [id number] *  PROCEDURE DETAILS: Patient is a 78 year old gentleman diabetic neuropathy with osteomyelitis of the calcaneus he has failed limb salvage surgery and presents at this time for transtibial amputation. Risks and benefits were discussed including infection neurovascular injury persistent pain DVT nonhealing of the wound need for additional surgery patient states he understands and wished to proceed at this time. Description of procedure patient was brought to the OR and underwent a general anesthetic. After adequate levels of anesthesia were obtained patient's left lower extremity was prepped using DuraPrep draped into a sterile field in the foot was draped out of the sterile field into an impervious stocking at. A transverse incision was made 11 cm distal to the tibial tubercle this curved proximally and a large posterior flap was created. The tibia was transected 1 cm proximal to the skin incision beveled anteriorly and the fibula was transected just proximal to the tibial incision. An amputation knife was used to create a large posterior flap. The sciatic nerve was pulled cut and allowed to retract. The vascular bundles were suture ligated x2 each with 2-0 silk. The tourniquet was deflated after possibly 9 minutes hemostasis was obtained the deep and superficial fascial layers were closed using #1 PDS the skin was closed using staples the wound was covered with Adaptic orthopedic sponges AB dressing Kerlix and Coban. Patient was  extubated taken to the PACU in stable condition plan for discharge on Monday.  PLAN OF CARE: Admit to inpatient   PATIENT DISPOSITION:  PACU - hemodynamically stable.   Nadara Mustard, MD 08/12/2011 11:39 AM

## 2011-08-12 NOTE — Preoperative (Signed)
Beta Blockers   Reason not to administer Beta Blockers:Not Applicable 

## 2011-08-12 NOTE — Anesthesia Preprocedure Evaluation (Signed)
Anesthesia Evaluation  Patient identified by MRN, date of birth, ID band Patient awake    Reviewed: Allergy & Precautions, H&P , NPO status , Patient's Chart, lab work & pertinent test results  Airway Mallampati: I TM Distance: >3 FB Neck ROM: Full    Dental  (+) Teeth Intact and Dental Advisory Given   Pulmonary shortness of breath,  breath sounds clear to auscultation        Cardiovascular hypertension, + CAD and + Past MI + dysrhythmias Rhythm:Regular Rate:Normal     Neuro/Psych    GI/Hepatic GERD-  ,  Endo/Other    Renal/GU      Musculoskeletal   Abdominal   Peds  Hematology   Anesthesia Other Findings   Reproductive/Obstetrics                           Anesthesia Physical Anesthesia Plan  ASA: III  Anesthesia Plan: General   Post-op Pain Management:    Induction: Intravenous  Airway Management Planned: LMA  Additional Equipment:   Intra-op Plan:   Post-operative Plan: Extubation in OR  Informed Consent: I have reviewed the patients History and Physical, chart, labs and discussed the procedure including the risks, benefits and alternatives for the proposed anesthesia with the patient or authorized representative who has indicated his/her understanding and acceptance.   History available from chart only  Plan Discussed with: CRNA and Surgeon  Anesthesia Plan Comments:         Anesthesia Quick Evaluation

## 2011-08-12 NOTE — Transfer of Care (Signed)
Immediate Anesthesia Transfer of Care Note  Patient: Benjamin [name]  Procedure(s) Performed: Procedure(s) (LRB): AMPUTATION BELOW KNEE (Left)  Patient Location: PACU  Anesthesia Type: General  Level of Consciousness: awake, alert , oriented and patient cooperative  Airway & Oxygen Therapy: Patient Spontanous Breathing and Patient connected to nasal cannula oxygen  Post-op Assessment: Report given to PACU RN, Post -op Vital signs reviewed and stable and Patient moving all extremities  Post vital signs: Reviewed and stable  Complications: No apparent anesthesia complications

## 2011-08-13 ENCOUNTER — Encounter (HOSPITAL_COMMUNITY): Payer: Self-pay | Admitting: Orthopedic Surgery

## 2011-08-13 DIAGNOSIS — I739 Peripheral vascular disease, unspecified: Secondary | ICD-10-CM

## 2011-08-13 DIAGNOSIS — L98499 Non-pressure chronic ulcer of skin of other sites with unspecified severity: Secondary | ICD-10-CM

## 2011-08-13 DIAGNOSIS — S88119A Complete traumatic amputation at level between knee and ankle, unspecified lower leg, initial encounter: Secondary | ICD-10-CM

## 2011-08-13 LAB — GLUCOSE, CAPILLARY
Glucose-Capillary: 99 mg/dL (ref 70–99)
Glucose-Capillary: 98 mg/dL (ref 70–99)

## 2011-08-13 NOTE — Progress Notes (Signed)
Patient ID: Benjamin [name], male   DOB: 08-19-1933, 78 y.o.   MRN: [id number] Postoperative day 1 left transtibial amputation.  Patient was treated yesterday for hyperkalemia and his potassium is now 5.2.  Initial plans for discharge to skilled nursing however the family think that it will be possible for him to be discharged to home. Plan for evaluation for discharge to home on Monday. Inpatient rehabilitation may be an option evaluation for inpatient rehabilitation requested today

## 2011-08-13 NOTE — Progress Notes (Signed)
CARE MANAGEMENT NOTE 08/13/2011  Patient:  Benjamin [name], Benjamin [name]   Account Number:  [id number]  Date Initiated:  08/13/2011  Documentation initiated by:  Vance Peper  Subjective/Objective Assessment:   78 yr old male s/p left BKA.     Action/Plan:   CM spoke with patient and wife regarding discharge plans. Patient wants to go to CIR. waiting for PT/OT evals. Wife states if not home with [facility] or to [facility] for rehab.Will follow. Will inform Child psychotherapist.   Anticipated DC Date:  08/14/2011   Anticipated DC Plan:    In-house referral  Clinical Social Worker      DC Planning Services  CM consult      Choice offered to / List presented to:             Status of service:  In process, will continue to follow Medicare Important Message given?   (If response is "NO", the following Medicare IM given date fields will be blank) Date Medicare IM given:   Date Additional Medicare IM given:    Discharge Disposition:    Per UR Regulation:    If discussed at Long Length of Stay Meetings, dates discussed:    Comments:

## 2011-08-13 NOTE — Consult Note (Signed)
Physical Medicine and Rehabilitation Consult Reason for Consult: Left below-knee amputation Referring Physician: Dr. Lajoyce Corners   HPI: Benjamin [name] is a 78 y.o. right-handed male with history of coronary artery disease and bypass grafting, diabetes mellitus with peripheral neuropathy and chronic renal insufficiency with creatinine baseline 1.56. Admitted 08/12/2011 with osteomyelitis abscess draining from the left calcaneus and multiple revascularization procedures . Patient has been wheelchair bound essentially since March. Limb was not felt to be salvageable and underwent left below-knee amputation 08/12/2011 per Dr. Lajoyce Corners. Postoperative pain management. Bouts of hypokalemia 6.0 and treated with latest potassium level 5.2. Physical and occupational therapy evaluations were pending. M.D. is requested physical medicine rehabilitation consult to consider inpatient rehabilitation services.   Review of Systems  Cardiovascular: Positive for palpitations and leg swelling.  Gastrointestinal: Positive for constipation.  Musculoskeletal: Positive for myalgias.  Neurological: Positive for weakness.  All other systems reviewed and are negative.   Past Medical History  Diagnosis Date  . CAD (coronary artery disease)     Severe native, Statuspost coronary bypass grafting 2004 with a I to the LAD and the sac was vein graft obtuse marginal and a  second vein graft to the diagonal. Preserved LV function with an ejection fraction of 55%  . Upper GI bleeding     history of upper GI bleeding status pst EGD and colonoscopy. Anicoagulation with aspirin and plavix  . Iron deficiency anemia     Secondary to GI bleed improved with current therapy  . Diabetes mellitus   . Hypertension   . Obesity   . DJD (degenerative joint disease)   . Internal mammary artery injury     Continued patency of the internal mammary to the LAD, occlusion of the saphenous vein graft to the diagonal, severe multiple high-grade lesions  with large clot burder in saphenous vein graft to the OM with small distal target, and high-grade in-stent restenosis  . Atrial fibrillation     History of postoperative atrial fibrillation  . Chronic renal insufficiency     rather than baseline 1.56  . GERD (gastroesophageal reflux disease)   . Peripheral vascular disease     s/p arteriogram by Dr. Excell Seltzer, bilaterally  . Hyperlipidemia   . Pneumonia     history 2003   Past Surgical History  Procedure Date  . Coronary stents     three  . Lumbar fusions   . Laminectomy   . Anterior cervical discectomy   . Cardiac catheterization     2006  . Coronary artery bypass graft     stents x 3  . I&d extremity 04/07/2011    Procedure: IRRIGATION AND DEBRIDEMENT EXTREMITY;  Surgeon: Nadara Mustard, MD;  Location: [facility] OR;  Service: Orthopedics;  Laterality: Left;  Left Partial Calcaneal Excision, Place Antibiotic Beads   Family History  Problem Relation Age of Onset  . Heart disease Father   . Coronary artery disease Mother   . Coronary artery disease Brother    Social History:  reports that he has never smoked. He has never used smokeless tobacco. He reports that he does not drink alcohol or use illicit drugs. Allergies:  Allergies  Allergen Reactions  . Penicillins     REACTION: rash  . Percocet (Oxycodone-Acetaminophen) Nausea And Vomiting   Medications Prior to Admission  Medication Sig Dispense Refill  . amLODipine (NORVASC) 5 MG tablet Take 5 mg by mouth daily.      Marland Kitchen aspirin 81 MG tablet Take 81 mg by mouth daily.        Marland Kitchen  carvedilol (COREG) 3.125 MG tablet Take 3.125 mg by mouth 2 (two) times daily.      . clopidogrel (PLAVIX) 75 MG tablet Take 75 mg by mouth daily.      Marland Kitchen doxazosin (CARDURA) 4 MG tablet Take 4 mg by mouth 2 (two) times daily.      Marland Kitchen doxycycline (VIBRAMYCIN) 100 MG capsule Take 100 mg by mouth 2 (two) times daily.      . ferrous sulfate 325 (65 FE) MG tablet Take 1 tablet (325 mg total) by mouth 3 (three)  times daily with meals.      . gabapentin (NEURONTIN) 300 MG capsule Take 300 mg by mouth 3 (three) times daily.      Marland Kitchen glipiZIDE (GLUCOTROL XL) 10 MG 24 hr tablet Take 10 mg by mouth daily.      . hydrochlorothiazide (HYDRODIURIL) 25 MG tablet Take 25 mg by mouth daily.      Marland Kitchen ibuprofen (ADVIL,MOTRIN) 200 MG tablet Take 400 mg by mouth every 6 (six) hours as needed.      . isosorbide mononitrate (IMDUR) 30 MG 24 hr tablet Take 30 mg by mouth daily with breakfast.       . L-Methylfolate-B6-B12 (METANX) 3-35-2 MG TABS Take 1 tablet by mouth daily.        . metFORMIN (GLUCOPHAGE) 500 MG tablet Take 500 mg by mouth 2 (two) times daily as needed. For diabetes. If cbg is greater than 150.      . Multiple Vitamin (MULITIVITAMIN WITH MINERALS) TABS Take 1 tablet by mouth daily.      . pantoprazole (PROTONIX) 40 MG tablet Take 40 mg by mouth at bedtime.       . pravastatin (PRAVACHOL) 80 MG tablet Take 80 mg by mouth at bedtime.       . traMADol (ULTRAM) 50 MG tablet Take 50 mg by mouth every 6 (six) hours as needed. For pain      . nitroGLYCERIN (NITROSTAT) 0.4 MG SL tablet Place 0.4 mg under the tongue every 5 (five) minutes as needed. For chest pain        Home:    Functional History:   Functional Status:  Mobility:          ADL:    Cognition: Cognition Orientation Level: Oriented X4    Blood pressure 151/54, pulse 60, temperature 98.8 F (37.1 C), temperature source Oral, resp. rate 20, SpO2 99.00%. Physical Exam  Vitals reviewed. Constitutional: He is oriented to person, place, and time.  HENT:  Head: Normocephalic.  Eyes: Conjunctivae and EOM are normal.       Pupils round and reactive to light  Neck: Normal range of motion. Neck supple. No thyromegaly present.  Cardiovascular: Normal rate.   Pulmonary/Chest: Breath sounds normal. No respiratory distress. He has no wheezes.  Abdominal: Bowel sounds are normal. He exhibits no distension. There is no tenderness.    Neurological: He is alert and oriented to person, place, and time.  Skin:       Left below knee amputation site is dressed  Psychiatric: He has a normal mood and affect.  motor strength: 5/5 in bilateral deltoid, biceps, triceps, grip, 4/5 in the right hip flexor knee extensor ankle dorsiflexor plantar flexor. Left lower extremity has 3 minus hip flexor. Status post BKA Sensory shows mild light touch deficit in the right foot. Extremity right foot intrinsic atrophy Results for orders placed during the hospital encounter of 08/12/11 (from the past 24 hour(s))  SURGICAL PCR  SCREEN     Status: Normal   Collection Time   08/12/11  8:33 AM      Component Value Range   MRSA, PCR NEGATIVE  NEGATIVE   Staphylococcus aureus NEGATIVE  NEGATIVE  APTT     Status: Normal   Collection Time   08/12/11  8:50 AM      Component Value Range   aPTT 32  24 - 37 seconds  CBC     Status: Abnormal   Collection Time   08/12/11  8:50 AM      Component Value Range   WBC 5.9  4.0 - 10.5 K/uL   RBC 4.40  4.22 - 5.81 MIL/uL   Hemoglobin 12.0 (*) 13.0 - 17.0 g/dL   HCT 29.5 (*) 28.4 - 13.2 %   MCV 82.3  78.0 - 100.0 fL   MCH 27.3  26.0 - 34.0 pg   MCHC 33.1  30.0 - 36.0 g/dL   RDW 44.0  10.2 - 72.5 %   Platelets 141 (*) 150 - 400 K/uL  COMPREHENSIVE METABOLIC PANEL     Status: Abnormal   Collection Time   08/12/11  8:50 AM      Component Value Range   Sodium 143  135 - 145 mEq/L   Potassium 5.4 (*) 3.5 - 5.1 mEq/L   Chloride 107  96 - 112 mEq/L   CO2 27  19 - 32 mEq/L   Glucose, Bld 160 (*) 70 - 99 mg/dL   BUN 34 (*) 6 - 23 mg/dL   Creatinine, Ser 3.66 (*) 0.50 - 1.35 mg/dL   Calcium 9.7  8.4 - 44.0 mg/dL   Total Protein 7.3  6.0 - 8.3 g/dL   Albumin 3.8  3.5 - 5.2 g/dL   AST 22  0 - 37 U/L   ALT 19  0 - 53 U/L   Alkaline Phosphatase 57  39 - 117 U/L   Total Bilirubin 0.3  0.3 - 1.2 mg/dL   GFR calc non Af Amer 36 (*) >90 mL/min   GFR calc Af Amer 42 (*) >90 mL/min  PROTIME-INR     Status: Normal    Collection Time   08/12/11  8:50 AM      Component Value Range   Prothrombin Time 13.7  11.6 - 15.2 seconds   INR 1.03  0.00 - 1.49  GLUCOSE, CAPILLARY     Status: Abnormal   Collection Time   08/12/11  9:07 AM      Component Value Range   Glucose-Capillary 151 (*) 70 - 99 mg/dL  GLUCOSE, CAPILLARY     Status: Abnormal   Collection Time   08/12/11 12:51 PM      Component Value Range   Glucose-Capillary 150 (*) 70 - 99 mg/dL   Comment 1 Documented in Chart     Comment 2 Notify RN    POTASSIUM     Status: Abnormal   Collection Time   08/12/11  3:00 PM      Component Value Range   Potassium 6.0 (*) 3.5 - 5.1 mEq/L  GLUCOSE, CAPILLARY     Status: Abnormal   Collection Time   08/12/11  3:46 PM      Component Value Range   Glucose-Capillary 210 (*) 70 - 99 mg/dL   Comment 1 Notify RN    POTASSIUM     Status: Abnormal   Collection Time   08/12/11  9:18 PM      Component Value Range  Potassium 5.2 (*) 3.5 - 5.1 mEq/L  GLUCOSE, CAPILLARY     Status: Abnormal   Collection Time   08/12/11 10:08 PM      Component Value Range   Glucose-Capillary 177 (*) 70 - 99 mg/dL  POTASSIUM     Status: Normal   Collection Time   08/13/11  4:45 AM      Component Value Range   Potassium 5.0  3.5 - 5.1 mEq/L   Dg Chest 2 View  08/12/2011  *RADIOLOGY REPORT*  Clinical Data: Left BKA, coronary artery disease, diabetes, hypertension  CHEST - 2 VIEW  Comparison: July 01, 2010  Findings: Cardiomegaly is unchanged.  There is pulmonary vascular engorgement without frank edema.  The mediastinum is within normal limits.  Both lungs are clear.  The right CP angle is cut off the film.  The inferior most sternotomy wire is fractured, unchanged. Hypertrophic degenerative changes are present within the thoracic spine.  IMPRESSION: Cardiomegaly and pulmonary vascular engorgement without frank edema, unchanged.  Original Report Authenticated By: Brandon Melnick, M.D.    Assessment/Plan: Diagnosis: left BKA postop day  #1. 1. Does the need for close, 24 hr/day medical supervision in concert with the patient's rehab needs make it unreasonable for this patient to be served in a less intensive setting? Potentially 2. Co-Morbidities requiring supervision/potential complications: diabetes with neuropathy, hypertension, chronic renal insufficiency, peripheral vascular disease with history of aneurysm 3. Due to bladder management, bowel management, safety, skin/wound care, medication administration, pain management and patient education, does the patient require 24 hr/day rehab nursing? Potentially 4. Does the patient require coordinated care of a physician, rehab nurse, PT (1-2 hrs/day, 5 days/week) and OT (1-2 hrs/day, 5 days/week) to address physical and functional deficits in the context of the above medical diagnosis(es)? Potentially Addressing deficits in the following areas: balance, endurance, locomotion, strength, transferring, bowel/bladder control, bathing, dressing and toileting 5. Can the patient actively participate in an intensive therapy program of at least 3 hrs of therapy per day at least 5 days per week? Potentially 6. The potential for patient to make measurable gains while on inpatient rehab is excellent 7. Anticipated functional outcomes upon discharge from inpatient rehab are modified independent mobility with PT,modified independent level ADLs with OT, not applicable with SLP. 8. Estimated rehab length of stay to reach the above functional goals is: 7 days 9. Does the patient have adequate social supports to accommodate these discharge functional goals? Yes 10. Anticipated D/C setting: Home 11. Anticipated post D/C treatments: HH therapy 12. Overall Rehab/Functional Prognosis: excellent  RECOMMENDATIONS: This patient's condition is appropriate for continued rehabilitative care in the following setting: admission rehabilitation RN to followup on therapy progress. If still requiring min assist level  by postop day #3 would benefit from CIR Patient has agreed to participate in recommended program. Yes Note that insurance prior authorization may be required for reimbursement for recommended care.  Comment:has very good truncal stability and very good upper extremity strength likely will be of little home with home health therapy.    08/13/2011

## 2011-08-13 NOTE — Clinical Social Work Placement (Addendum)
    Clinical Social Work Department CLINICAL SOCIAL WORK PLACEMENT NOTE 08/13/2011  Patient:  [name], [name]  Account Number:  [id number] Admit date:  08/12/2011  Clinical Social Worker:  Lupita Leash Raeley Gilmore, BSW  Date/time:  08/13/2011 04:52 PM  Clinical Social Work is seeking post-discharge placement for this patient at the following level of care:   SKILLED NURSING   (*CSW will update this form in Epic as items are completed)     Patient/family provided with [facility] Department of Clinical Social Work's list of facilities offering this level of care within the geographic area requested by the patient (or if unable, by the patient's family).    Patient/family informed of their freedom to choose among providers that offer the needed level of care, that participate in Medicare, Medicaid or managed care program needed by the patient, have an available bed and are willing to accept the patient.    Patient/family informed of [facility]' ownership interest in [facility], as well as of the fact that they are under no obligation to receive care at this facility.  PASARR submitted to EDS on  PASARR number received from EDS on   FL2 transmitted to all facilities in geographic area requested by pt/family on  08/13/2011 FL2 transmitted to all facilities within larger geographic area on   Patient informed that his/her managed care company has contracts with or will negotiate with  certain facilities, including the following:   Patient is covered under his wife's insurance -- [employer]. Medicare is secondary.  Considering placement in [place]- no PASARR is needed.  They deferred SNF list.     Patient/family informed of bed offers received: 08/16/11 Patient chooses bed at [facility] Physician recommends and patient chooses bed at    Patient to be transferred to [facility] on  08/16/11 Patient to be transferred to facility by Car with wife  (per patient/wife's request)  The following physician request were entered in Epic:   Additional Comments:  Patient and wife decided to forgo CIR placement and chose to go to [facility] for rehab. OK per facility. MD OK for dc today.  Notified SNF and patient's nurse Rella Larve.   Lorri Frederick. [place]  [phone]

## 2011-08-13 NOTE — Clinical Social Work Psychosocial (Signed)
     Clinical Social Work Department BRIEF PSYCHOSOCIAL ASSESSMENT 08/13/2011  Patient:  Benjamin [name], Benjamin [name]     Account Number:  [id number]     Admit date:  08/12/2011  Clinical Social Worker:  Burnard Hawthorne  Date/Time:  08/13/2011 04:45 PM  Referred by:  Physician  Date Referred:  08/13/2011 Referred for  SNF Placement   Other Referral:   Wants CIR as first choice-  if not able to accept-- then will consider SNF. Jola Babinski-  CIR RN Liason spoke with patient and wife as well.   Interview type:  Other - See comment Other interview type:   Met with patient and family    PSYCHOSOCIAL DATA Living Status:  WIFE Admitted from facility:   Level of care:   Primary support name:  [facility]  [phone] Primary support relationship to patient:  SPOUSE Degree of support available:   Very strong support    CURRENT CONCERNS Current Concerns  Post-Acute Placement   Other Concerns:   CIR vs SNF vs Home    SOCIAL WORK ASSESSMENT / PLAN Met with patient and his wife today to discuss d/c needs. Currently they are awaiting insurance and medical authorization for transfer to CIR.  If not able to acccept- wife wants to either return home "for a few weeks" with patient and then seek SNF at [facility] in [place], [place].  Discussed possibility of placement from hospital to SNF if needed and they wil consider but really hope for placement in CIR.  Fl2 placed on chart; was faxed to [facility] for review as well.  CSW to follow.   Assessment/plan status:  Psychosocial Support/Ongoing Assessment of Needs Other assessment/ plan:   ? inpt rehab   Information/referral to community resources:   SNF slist deffered as they will only consider [place].  Wife works at [employer] in [place] but does not wish to consider [facility].  CIR liason provided information.    PATIENTS/FAMILYS RESPONSE TO PLAN OF CARE: Patient is alert, oriented and very plesasant. He and his  wife have a very positive attitude regarding rehab plan.

## 2011-08-13 NOTE — Evaluation (Signed)
Physical Therapy Evaluation Patient Details Name: Benjamin [name] MRN: [id number] DOB: 1933/09/20 Today's Date: 08/13/2011 Time: 1030-1055 PT Time Calculation (min): 25 min  PT Assessment / Plan / Recommendation Clinical Impression  Pt is 78 y/o male admitted for s/p left BKA.  Pt very motivated and willing to work.  Pt will benefit from acute PT services to improve overall mobility.    PT Assessment  Patient needs continued PT services    Follow Up Recommendations  Inpatient Rehab    Barriers to Discharge        Equipment Recommendations  Defer to next venue    Recommendations for Other Services Rehab consult   Frequency Min 5X/week    Precautions / Restrictions Precautions Precautions: Fall Restrictions Weight Bearing Restrictions: Yes LLE Weight Bearing: Non weight bearing   Pertinent Vitals/Pain C/o pain but does not rate       Mobility  Bed Mobility Bed Mobility: Supine to Sit;Sitting - Scoot to Edge of Bed Supine to Sit: 3: Mod assist;HOB flat Sitting - Scoot to [place] of Bed: 4: Min assist Details for Bed Mobility Assistance: (A) to elevate trunk OOB with max cues for technique Transfers Transfers: Sit to Stand;Stand to Sit;Stand Pivot Transfers Sit to Stand: 1: +2 Total assist;From bed Sit to Stand: Patient Percentage: 60% Stand to Sit: 1: +2 Total assist;To chair/3-in-1 Stand to Sit: Patient Percentage: 60% Stand Pivot Transfers: 1: +2 Total assist;From elevated surface Stand Pivot Transfers: Patient Percentage: 60% Details for Transfer Assistance: (A) to maintain balance with max cues for hand placement.  Pt able to complete transfer with max cues for hand placement and LE placement.  Cues to advance hips to recliner.    Exercises General Exercises - Lower Extremity Quad Sets: Strengthening;Left;10 reps Hip ABduction/ADduction: Strengthening;Left;10 reps Straight Leg Raises: Strengthening;Left;10 reps   PT Diagnosis: Generalized weakness;Acute  pain;Difficulty walking  PT Problem List: Decreased strength;Decreased range of motion;Decreased activity tolerance;Decreased balance;Decreased mobility;Decreased knowledge of use of DME;Pain PT Treatment Interventions: DME instruction;Gait training;Stair training;Functional mobility training;Therapeutic activities;Therapeutic exercise;Balance training;Patient/family education   PT Goals Acute Rehab PT Goals PT Goal Formulation: With patient Time For Goal Achievement: 08/20/11 Potential to Achieve Goals: Good Pt will go Supine/Side to Sit: with modified independence PT Goal: Supine/Side to Sit - Progress: Goal set today Pt will go Sit to Supine/Side: with modified independence PT Goal: Sit to Supine/Side - Progress: Goal set today Pt will go Sit to Stand: with min assist PT Goal: Sit to Stand - Progress: Goal set today Pt will go Stand to Sit: with min assist PT Goal: Stand to Sit - Progress: Goal set today Pt will Transfer Bed to Chair/Chair to Bed: with min assist PT Transfer Goal: Bed to Chair/Chair to Bed - Progress: Goal set today Pt will Stand: with min assist;1 - 2 min;with bilateral upper extremity support PT Goal: Stand - Progress: Goal set today Pt will Ambulate: 1 - 15 feet;with +2 total assist;with rolling walker (pt 60%) PT Goal: Ambulate - Progress: Goal set today  Visit Information  Last PT Received On: 08/13/11 Assistance Needed: +2    Subjective Data  Subjective: "I guess we can try to get moving." Patient Stated Goal: To eventually go home.   Prior Functioning  Home Living Lives With: Spouse Available Help at Discharge: Family Type of Home: House Additional Comments: Pt has been NWB left LE for several weeks prior to recent BKA Prior Function Level of Independence: Independent with assistive device(s) (Using W/C for primary mobility) Needs  Assistance: Light Housekeeping;Meal Prep Communication Communication: No difficulties Dominant Hand: Right      Cognition  Overall Cognitive Status: Appears within functional limits for tasks assessed/performed Arousal/Alertness: Awake/alert Orientation Level: Appears intact for tasks assessed Behavior During Session: [facility] for tasks performed    Extremity/Trunk Assessment Right Upper Extremity Assessment RUE ROM/Strength/Tone: Within functional levels RUE Sensation: WFL - Light Touch Left Upper Extremity Assessment LUE ROM/Strength/Tone: Within functional levels LUE Sensation: WFL - Light Touch Right Lower Extremity Assessment RLE ROM/Strength/Tone: Within functional levels RLE Sensation: WFL - Light Touch Left Lower Extremity Assessment LLE ROM/Strength/Tone: Unable to fully assess;Due to pain;Deficits LLE ROM/Strength/Tone Deficits: Limited AAROM knee extension lacking 5 degrees   Balance    End of Session PT - End of Session Equipment Utilized During Treatment: Gait belt Activity Tolerance: Patient tolerated treatment well Patient left: in chair;with call bell/phone within reach;with family/visitor present Nurse Communication: Mobility status  GP     Dashana Guizar 08/13/2011, 1:25 PM Jake Shark, PT DPT [phone]

## 2011-08-13 NOTE — Progress Notes (Signed)
Inpatient Rehab Admissions: Met with pt and spouse to discuss CIR. Pt will benefit from rehab admission. I will begin insurance authorization and f/u Mon.

## 2011-08-14 LAB — BASIC METABOLIC PANEL
BUN: 34 mg/dL — ABNORMAL HIGH (ref 6–23)
Chloride: 105 mEq/L (ref 96–112)
GFR calc Af Amer: 39 mL/min — ABNORMAL LOW (ref >90)
GFR calc non Af Amer: 34 mL/min — ABNORMAL LOW (ref >90)
Glucose, Bld: 57 mg/dL — ABNORMAL LOW (ref 70–99)
Potassium: 4.5 mEq/L (ref 3.5–5.1)
Sodium: 139 mEq/L (ref 135–145)

## 2011-08-14 LAB — GLUCOSE, CAPILLARY: Glucose-Capillary: 107 mg/dL — ABNORMAL HIGH (ref 70–99)

## 2011-08-14 MED ORDER — CLOPIDOGREL BISULFATE 75 MG PO TABS
75.0000 mg | ORAL_TABLET | Freq: Every day | ORAL | Status: DC
  Administered 2011-08-14 – 2011-08-15 (×2): 75 mg via ORAL
  Filled 2011-08-14 (×3): qty 1

## 2011-08-14 MED ORDER — GABAPENTIN 100 MG PO CAPS
200.0000 mg | ORAL_CAPSULE | Freq: Three times a day (TID) | ORAL | Status: DC
  Administered 2011-08-14 – 2011-08-16 (×6): 200 mg via ORAL
  Filled 2011-08-14 (×8): qty 2

## 2011-08-14 NOTE — Progress Notes (Signed)
Physical Therapy Treatment Patient Details Name: Benjamin [name] MRN: [id number] DOB: 02-Nov-1933 Today's Date: 08/14/2011 Time: 1040-1110 PT Time Calculation (min): 30 min  PT Assessment / Plan / Recommendation Comments on Treatment Session  Educated pt and spouse on postioning of left leg to prevent knee and hip contractures in prep for prosthesis. Educated both on importance on prone lying for hip extensor stretching and on hip ext exercises, as well as on importance of keeping left knee as staight as posible. Pt with approx 5-10 degrees of knee flexion on resting position with hip external rotation. pt and spouse to work on [employer] with pillows used to promote knee ext streching while awake/sitting up.                           Follow Up Recommendations  Inpatient Rehab       Equipment Recommendations  Defer to next venue    Recommendations for Other Services Rehab consult  Frequency Min 5X/week   Plan Discharge plan remains appropriate;Frequency remains appropriate    Precautions / Restrictions Precautions Precautions: Fall Restrictions LLE Weight Bearing: Non weight bearing    Pertinent Vitals/Pain Pt rated pain 5-6/10 before and after session. Was premedicated.    Mobility  Transfers Transfers: Sit to Stand;Stand to Sit Sit to Stand: 3: Mod assist;With upper extremity assist;With armrests;From chair/3-in-1 Sit to Stand: Patient Percentage: 60% Stand to Sit: 3: Mod assist;With armrests;To chair/3-in-1;With upper extremity assist Stand to Sit: Patient Percentage: 70% Details for Transfer Assistance: stood at [facility] for about 5 minutes working on upright posture and left LE exercises with min guard to min assit for balance.    Exercises Amputee Exercises Quad Sets: AROM;Strengthening;Left;10 reps;Seated (reclined in chair) Hip Extension: AAROM;Strengthening;Left;10 reps;Standing Hip ABduction/ADduction: AAROM;Strengthening;Left;10 reps;Seated;Standing  (reclined in chair and stdg-10 reps each position) Hip Flexion/Marching: AROM;Strengthening;Left;10 reps;Standing   PT Goals Acute Rehab PT Goals PT Goal: Sit to Stand - Progress: Progressing toward goal PT Goal: Stand to Sit - Progress: Progressing toward goal PT Goal: Stand - Progress: Met  Visit Information  Last PT Received On: 08/14/11 Assistance Needed: +2    Subjective Data  Subjective: Pt agreeable to therapy today and with no new complaints.   Cognition  Overall Cognitive Status: Appears within functional limits for tasks assessed/performed Arousal/Alertness: Awake/alert Orientation Level: Appears intact for tasks assessed       End of Session PT - End of Session Equipment Utilized During Treatment: Gait belt Activity Tolerance: Patient tolerated treatment well;Patient limited by pain Patient left: in chair;with call bell/phone within reach;with family/visitor present Nurse Communication: Mobility status    Sallyanne Kuster 08/14/2011, 12:56 PM  Sallyanne Kuster, PTA Office- [phone]

## 2011-08-14 NOTE — Progress Notes (Signed)
Occupational Therapy Evaluation Patient Details Name: Benjamin [name] MRN: [id number] DOB: 05/22/1933 Today's Date: 08/14/2011 Time: 9147-8295 OT Time Calculation (min): 30 min  OT Assessment / Plan / Recommendation Clinical Impression  78 yo s/p LLE BKA. Pt will benefit from skilled OT to max independence with ADL and functional mobility for ADL due to deficits below. Pt will benefit from CIR to return home with wife, who can provide 24/7 A after D/C.    OT Assessment  Patient needs continued OT Services    Follow Up Recommendations  Inpatient Rehab    Barriers to Discharge None    Equipment Recommendations  Defer to next venue    Recommendations for Other Services Rehab consult  Frequency  Min 3X/week    Precautions / Restrictions Precautions Precautions: Fall Restrictions Weight Bearing Restrictions: Yes LLE Weight Bearing: Non weight bearing   Pertinent Vitals/Pain 3 without movement    ADL  Eating/Feeding: Performed;Independent Where Assessed - Eating/Feeding: Edge of bed Grooming: Simulated;Set up Where Assessed - Grooming: Supported sitting Upper Body Bathing: Simulated;Set up Where Assessed - Upper Body Bathing: Supported sitting Lower Body Bathing: Simulated;Maximal assistance Where Assessed - Lower Body Bathing: Supported sitting;Lean right and/or left Upper Body Dressing: Simulated;Set up Where Assessed - Upper Body Dressing: Supported sitting Lower Body Dressing: Simulated;Maximal assistance Where Assessed - Lower Body Dressing: Supine, head of bed up;Rolling right and/or left Toilet Transfer: Simulated;+2 Total assistance Transfers/Ambulation Related to ADLs: =2 total A ADL Comments: A for LB ADL primarily. cues for correct opsitioning of LLE at rest - pt prefers to position in knee flxion. Also started pt on desensitization.    OT Diagnosis: Generalized weakness;Acute pain  OT Problem List: Decreased strength;Decreased range of motion;Decreased activity  tolerance;Impaired balance (sitting and/or standing);Decreased knowledge of use of DME or AE;Decreased knowledge of precautions;Obesity;Pain OT Treatment Interventions: Self-care/ADL training;Therapeutic exercise;Energy conservation;DME and/or AE instruction;Therapeutic activities;Patient/family education;Balance training   OT Goals Acute Rehab OT Goals OT Goal Formulation: With patient Time For Goal Achievement: 08/21/11 Potential to Achieve Goals: Good ADL Goals Pt Will Perform Lower Body Bathing: with min assist;Sitting, edge of bed;Unsupported;with cueing (comment type and amount) (lean side to side) ADL Goal: Lower Body Bathing - Progress: Goal set today Pt Will Perform Lower Body Dressing: with min assist;Sitting, bed;Unsupported;with adaptive equipment;with cueing (comment type and amount) (lat leans) ADL Goal: Lower Body Dressing - Progress: Goal set today Pt Will Transfer to Toilet: with mod assist;Stand pivot transfer;with DME;3-in-1;Maintaining weight bearing status;with cueing (comment type and amount) ADL Goal: Toilet Transfer - Progress: Goal set today Pt Will Perform Toileting - Clothing Manipulation: with mod assist;Sitting on 3-in-1 or toilet;with cueing (comment type and amount) ADL Goal: Toileting - Clothing Manipulation - Progress: Goal set today Pt Will Perform Toileting - Hygiene: with min assist;Leaning right and/or left on 3-in-1/toilet;with cueing (comment type and amount) ADL Goal: Toileting - Hygiene - Progress: Goal set today Arm Goals Pt Will Complete Theraband Exer: with supervision, verbal cues required/provided;Bilateral upper extremities;to increase strength;Level 2 Theraband;15 reps Arm Goal: Theraband Exercises - Progress: Goal set today  Visit Information  Last OT Received On: 08/14/11 Assistance Needed: +2    Subjective Data   I want to go to rehab   Prior Functioning  Vision/Perception  Home Living Lives With: Spouse Available Help at  Discharge: Family Type of Home: House Home Access: Ramped entrance Home Layout: One level Bathroom Shower/Tub: Forensic scientist: Standard Bathroom Accessibility: Yes How Accessible: Accessible via wheelchair;Accessible via walker Home Adaptive Equipment:  Wheelchair - manual;Walker - rolling;Tub transfer bench;Raised toilet seat with rails Additional Comments: Pt has been NWB left LE for several weeks prior to recent BKA Prior Function Level of Independence: Independent with assistive device(s);Needs assistance (Using W/C for primary mobility) Needs Assistance: Light Housekeeping;Meal Prep;Transfers Meal Prep: Minimal Light Housekeeping: Minimal Transfer Assistance: supervision Driving: Yes (not much) Vocation: Retired Musician: No difficulties Dominant Hand: Right      Cognition  Overall Cognitive Status: Appears within functional limits for tasks assessed/performed Arousal/Alertness: Awake/alert Orientation Level: Appears intact for tasks assessed Behavior During Session: [facility] for tasks performed    Extremity/Trunk Assessment Right Upper Extremity Assessment RUE ROM/Strength/Tone: Within functional levels Left Upper Extremity Assessment LUE ROM/Strength/Tone: Within functional levels   Mobility Bed Mobility Bed Mobility: Supine to Sit Supine to Sit: 3: Mod assist Sitting - Scoot to Edge of Bed: 4: Min assist;With rail Transfers Details for Transfer Assistance: declined transfers tonite secondary to visitors present   Exercise    Balance    End of Session OT - End of Session Activity Tolerance: Patient tolerated treatment well Patient left: in bed;with call bell/phone within reach;with family/visitor present  GO     Benjamin [name],HILLARY 08/14/2011, 6:03 PM [facility], OTR/L  [phone] 08/14/2011

## 2011-08-14 NOTE — Progress Notes (Signed)
CBG recheck is 85.

## 2011-08-14 NOTE — Progress Notes (Signed)
Subjective: 2 Days Post-Op Procedure(s) (LRB): AMPUTATION BELOW KNEE (Left) this patient is awake alert oriented x4. He is awaiting a skilled nursing facility placement. Surgery was Thursday, 08/12/2011 left below-knee amputation. Remains on IV fluids and IV antibiotics. Expect he will be ready for skilled nursing facility early next week. Patient reports pain as 6 on 0-10 scale.    Objective: Vital signs in last 24 hours: Temp:  [98.6 F (37 C)-98.7 F (37.1 C)] 98.6 F (37 C) (08/03 0656) Pulse Rate:  [55-56] 56  (08/03 0656) Resp:  [18] 18  (08/03 0656) BP: (135-136)/(50-52) 135/50 mmHg (08/03 0656) SpO2:  [97 %-98 %] 97 % (08/03 0656)  Intake/Output from previous day: 08/02 0701 - 08/03 0700 In: 1443.3 [P.O.:480; I.V.:963.3] Out: -  Intake/Output this shift:     Basename 08/12/11 0850  HGB 12.0*    Basename 08/12/11 0850  WBC 5.9  RBC 4.40  HCT 36.2*  PLT 141*    Basename 08/14/11 0505 08/13/11 0445 08/12/11 0850  NA 139 -- 143  K 4.5 5.0 --  CL 105 -- 107  CO2 26 -- 27  BUN 34* -- 34*  CREATININE 1.85* -- 1.75*  GLUCOSE 57* -- 160*  CALCIUM 8.8 -- 9.7    Basename 08/12/11 0850  LABPT --  INR 1.03    ABD soft Neurovascular intact Intact pulses distally Incision: dressing C/D/I No cellulitis present  Assessment/Plan: 2 Days Post-Op Procedure(s) (LRB): AMPUTATION BELOW KNEE (Left) Advance diet Up with therapy D/C IV fluids Discharge to SNF early next week. Increase Neurontin dosage for a phantom pain urgent pain left leg. NITKA,Karrington E 08/14/2011, 1:05 PM

## 2011-08-15 LAB — GLUCOSE, CAPILLARY
Glucose-Capillary: 101 mg/dL — ABNORMAL HIGH (ref 70–99)
Glucose-Capillary: 118 mg/dL — ABNORMAL HIGH (ref 70–99)
Glucose-Capillary: 88 mg/dL (ref 70–99)
Glucose-Capillary: 88 mg/dL (ref 70–99)

## 2011-08-15 LAB — BASIC METABOLIC PANEL
BUN: 36 mg/dL — ABNORMAL HIGH (ref 6–23)
Glucose, Bld: 85 mg/dL (ref 70–99)

## 2011-08-15 MED ORDER — POLYETHYLENE GLYCOL 3350 17 G PO PACK
17.0000 g | PACK | Freq: Every day | ORAL | Status: DC
  Administered 2011-08-15 – 2011-08-16 (×2): 17 g via ORAL
  Filled 2011-08-15 (×2): qty 1

## 2011-08-15 NOTE — Progress Notes (Signed)
Orthopedic Tech Progress Note Patient Details:  Benjamin [name] February 02, 1933 [id number]  Patient ID: Hollie Salk, male   DOB: 1933-12-14, 78 y.o.   MRN: [id number] Confirmed pt has OHF on bed.  Leo Grosser T 08/15/2011, 12:33 PM

## 2011-08-15 NOTE — Progress Notes (Addendum)
Subjective: 3 Days Post-Op Procedure(s) (LRB): AMPUTATION BELOW KNEE (Left)  Benjamin [name] awake alert and in good spirits.  Patient reports pain as 3 on 0-10 scale and 4 on 0-10 scale.   NEURONTIN WAS INCREASED Objective: Vital signs in last 24 hours: Temp:  [98.5 F (36.9 C)-99.3 F (37.4 C)] 98.9 F (37.2 C) (08/04 0636) Pulse Rate:  [50-54] 50  (08/04 0636) Resp:  [16-18] 16  (08/04 0636) BP: (144-156)/(52-61) 144/61 mmHg (08/04 0636) SpO2:  [96 %-97 %] 96 % (08/04 0636)  Intake/Output from previous day: 08/03 0701 - 08/04 0700 In: 720 [P.O.:720] Out: 1200 [Urine:1200] Intake/Output this shift:    No results found for this basename: HGB:5 in the last 72 hours No results found for this basename: WBC:2,RBC:2,HCT:2,PLT:2 in the last 72 hours  Basename 08/15/11 0657 08/14/11 0505  NA 137 139  K 4.1 4.5  CL 102 105  CO2 25 26  BUN 36* 34*  CREATININE 1.79* 1.85*  GLUCOSE 85 57*  CALCIUM 9.1 8.8   No results found for this basename: LABPT:2,INR:2 in the last 72 hours  ABD soft Incision: dressing C/D/I No cellulitis present Compartment soft  Assessment/Plan: 3 Days Post-Op   Advance diet. FL-2 form signed Keep left leg dressing dry. D/C to SNF tomorrow.  Wardell Pokorski,Drury E 08/15/2011, 9:04 AM

## 2011-08-16 LAB — BASIC METABOLIC PANEL
BUN: 38 mg/dL — ABNORMAL HIGH (ref 6–23)
CO2: 27 mEq/L (ref 19–32)
Chloride: 100 mEq/L (ref 96–112)
GFR calc Af Amer: 44 mL/min — ABNORMAL LOW (ref >90)
Glucose, Bld: 115 mg/dL — ABNORMAL HIGH (ref 70–99)
Potassium: 4.4 mEq/L (ref 3.5–5.1)

## 2011-08-16 LAB — GLUCOSE, CAPILLARY: Glucose-Capillary: 143 mg/dL — ABNORMAL HIGH (ref 70–99)

## 2011-08-16 MED ORDER — HYDROCODONE-ACETAMINOPHEN 5-500 MG PO TABS: 1.0000 | ORAL_TABLET | Freq: Four times a day (QID) | ORAL | Status: AC | PRN

## 2011-08-16 NOTE — Progress Notes (Signed)
Occupational Therapy Treatment Patient Details Name: Benjamin [name] MRN: [id number] DOB: 1933/02/06 Today's Date: 08/16/2011 Time: 7829-5621 OT Time Calculation (min): 30 min  OT Assessment / Plan / Recommendation Comments on Treatment Session Excellent progress. Given written information on amputee rehab, including desensitization, positioning and strengthening. Pt to DC to rehab today.    Follow Up Recommendations  Skilled nursing facility    Barriers to Discharge       Equipment Recommendations  Defer to next venue    Recommendations for Other Services    Frequency     Plan Discharge plan needs to be updated    Precautions / Restrictions Precautions Precautions: Fall Restrictions LLE Weight Bearing: Non weight bearing   Pertinent Vitals/Pain none    ADL  Lower Body Dressing: Performed;Minimal assistance Where Assessed - Lower Body Dressing: Unsupported sitting;Lean right and/or left Transfers/Ambulation Related to ADLs: sit - stand mmod A ADL Comments: worked on LB D EOB    OT Diagnosis:    OT Problem List:   OT Treatment Interventions:     OT Goals Acute Rehab OT Goals OT Goal Formulation: With patient Time For Goal Achievement: 08/21/11 Potential to Achieve Goals: Good ADL Goals Pt Will Perform Lower Body Bathing: with min assist;Sitting, edge of bed;Unsupported;with cueing (comment type and amount) ADL Goal: Lower Body Bathing - Progress: Met Pt Will Perform Lower Body Dressing: with min assist;Sitting, bed;Unsupported;with adaptive equipment;with cueing (comment type and amount) ADL Goal: Lower Body Dressing - Progress: Met Pt Will Transfer to Toilet: with mod assist;Stand pivot transfer;with DME;3-in-1;Maintaining weight bearing status;with cueing (comment type and amount) ADL Goal: Toilet Transfer - Progress: Progressing toward goals Pt Will Perform Toileting - Clothing Manipulation: with mod assist;Sitting on 3-in-1 or toilet;with cueing (comment type and  amount) ADL Goal: Toileting - Clothing Manipulation - Progress: Progressing toward goals Arm Goals Pt Will Complete Theraband Exer: with supervision, verbal cues required/provided;Bilateral upper extremities;to increase strength;Level 2 Theraband;15 reps Arm Goal: Theraband Exercises - Progress: Met  Visit Information  Last OT Received On: 08/16/11 Assistance Needed: +1    Subjective Data      Prior Functioning       Cognition       Mobility Transfers Transfers: Sit to Stand;Stand to Sit Sit to Stand: 3: Mod assist;From bed Sit to Stand: Patient Percentage: 80% Stand to Sit: 3: Mod assist;To bed Stand to Sit: Patient Percentage: 80% Details for Transfer Assistance: vc for positioning   Exercises    Balance    End of Session OT - End of Session Equipment Utilized During Treatment: Gait belt Activity Tolerance: Patient tolerated treatment well Patient left: in bed;with call bell/phone within reach;with family/visitor present  GO     Keoki Mchargue,HILLARY 08/16/2011, 12:12 PM [facility], OTR/L  [phone] 08/16/2011

## 2011-08-16 NOTE — Progress Notes (Signed)
Inpatient Rehab Admissions: Met with pt and spouse re: plans for rehab. They have decided for pt to go to [facility] in [place], [place] for rehab due to convenience for spouse/proximity to home. Call [phone] for questions.

## 2011-08-16 NOTE — Discharge Summary (Signed)
Physician Discharge Summary  Patient ID: Benjamin [name] MRN: [id number] DOB/AGE: 17-Jul-1933 78 y.o.  Admit date: 08/12/2011 Discharge date: 08/16/2011  Admission Diagnoses: Osteomyelitis and abscess left foot status post foot salvage surgery.  Discharge Diagnoses: Same Active Problems:  * No active hospital problems. *    Discharged Condition: stable  Hospital Course: Patient's hospital course was essentially unremarkable he underwent a left transtibial amputation. Postoperatively patient progressed slowly and was felt to benefit from either inpatient rehabilitation or outpatient rehabilitation. Patient and his wife wish to proceed with outpatient rehabilitation.  Consults: nephrology  Significant Diagnostic Studies: labs: Routine labs  Treatments: surgery: Please see operative note  Discharge Exam: Blood pressure 151/49, pulse 62, temperature 98.7 F (37.1 C), temperature source Oral, resp. rate 16, SpO2 98.00%. Incision/Wound: dressing clean dry and intact at time of discharge  Disposition: 01-Home or Self Care  Discharge Orders    Future Orders Please Complete By Expires   Diet - low sodium heart healthy      Call MD / Call 911      Comments:   If you experience chest pain or shortness of breath, CALL 911 and be transported to the hospital emergency room.  If you develope a fever above 101 F, pus (white drainage) or increased drainage or redness at the wound, or calf pain, call your surgeon's office.   Constipation Prevention      Comments:   Drink plenty of fluids.  Prune juice may be helpful.  You may use a stool softener, such as Colace (over the counter) 100 mg twice a day.  Use MiraLax (over the counter) for constipation as needed.   Increase activity slowly as tolerated        Medication List  As of 08/16/2011  6:38 AM   TAKE these medications         amLODipine 5 MG tablet   Commonly known as: NORVASC   Take 5 mg by mouth daily.      aspirin 81 MG tablet   Take  81 mg by mouth daily.      carvedilol 3.125 MG tablet   Commonly known as: COREG   Take 3.125 mg by mouth 2 (two) times daily.      clopidogrel 75 MG tablet   Commonly known as: PLAVIX   Take 75 mg by mouth daily.      doxazosin 4 MG tablet   Commonly known as: CARDURA   Take 4 mg by mouth 2 (two) times daily.      doxycycline 100 MG capsule   Commonly known as: VIBRAMYCIN   Take 100 mg by mouth 2 (two) times daily.      ferrous sulfate 325 (65 FE) MG tablet   Take 1 tablet (325 mg total) by mouth 3 (three) times daily with meals.      gabapentin 300 MG capsule   Commonly known as: NEURONTIN   Take 300 mg by mouth 3 (three) times daily.      glipiZIDE 10 MG 24 hr tablet   Commonly known as: GLUCOTROL XL   Take 10 mg by mouth daily.      hydrochlorothiazide 25 MG tablet   Commonly known as: HYDRODIURIL   Take 25 mg by mouth daily.      HYDROcodone-acetaminophen 5-500 MG per tablet   Commonly known as: VICODIN   Take 1 tablet by mouth every 6 (six) hours as needed for pain.      ibuprofen 200 MG tablet  Commonly known as: ADVIL,MOTRIN   Take 400 mg by mouth every 6 (six) hours as needed.      isosorbide mononitrate 30 MG 24 hr tablet   Commonly known as: IMDUR   Take 30 mg by mouth daily with breakfast.      metFORMIN 500 MG tablet   Commonly known as: GLUCOPHAGE   Take 500 mg by mouth 2 (two) times daily as needed. For diabetes. If cbg is greater than 150.      multivitamin 3-35-2 MG Tabs tablet   Take 1 tablet by mouth daily.      multivitamin with minerals Tabs   Take 1 tablet by mouth daily.      nitroGLYCERIN 0.4 MG SL tablet   Commonly known as: NITROSTAT   Place 0.4 mg under the tongue every 5 (five) minutes as needed. For chest pain      pantoprazole 40 MG tablet   Commonly known as: PROTONIX   Take 40 mg by mouth at bedtime.      pravastatin 80 MG tablet   Commonly known as: PRAVACHOL   Take 80 mg by mouth at bedtime.      traMADol 50 MG  tablet   Commonly known as: ULTRAM   Take 50 mg by mouth every 6 (six) hours as needed. For pain           Follow-up Information    Follow up with Angelyne Terwilliger V, MD in 2 weeks.   Contact information:   [address] [place] [address] [phone]          Signed: Nadara Mustard 08/16/2011, 6:38 AM

## 2011-09-21 ENCOUNTER — Other Ambulatory Visit: Payer: Self-pay | Admitting: Cardiology

## 2011-10-11 ENCOUNTER — Other Ambulatory Visit: Payer: Self-pay | Admitting: Cardiology

## 2011-10-16 ENCOUNTER — Other Ambulatory Visit: Payer: Self-pay | Admitting: Cardiology

## 2011-10-28 ENCOUNTER — Other Ambulatory Visit: Payer: Self-pay | Admitting: Cardiology

## 2011-11-09 ENCOUNTER — Ambulatory Visit: Payer: PRIVATE HEALTH INSURANCE | Admitting: Cardiology

## 2011-11-25 ENCOUNTER — Other Ambulatory Visit: Payer: Self-pay | Admitting: Cardiology

## 2011-12-17 ENCOUNTER — Telehealth: Payer: Self-pay | Admitting: Cardiology

## 2011-12-17 NOTE — Telephone Encounter (Signed)
Mr. Benjamin [name] is coming to see Dr.Katz on 01-19-2012. He is scheduled for cataract surgery on 01/24/12 by Dr. Lita Mains. Dr. Lita Mains needs to know when Mr. Benjamin [name] can stop his Plavix.  Please call [name] ext [phone] with information.

## 2011-12-20 NOTE — Telephone Encounter (Signed)
Hold Plavix 5 days pre surgery, then resume once cleared to do so.

## 2011-12-20 NOTE — Telephone Encounter (Signed)
Wife informed

## 2012-01-19 ENCOUNTER — Encounter: Payer: Self-pay | Admitting: Cardiology

## 2012-01-19 ENCOUNTER — Ambulatory Visit (INDEPENDENT_AMBULATORY_CARE_PROVIDER_SITE_OTHER): Payer: PRIVATE HEALTH INSURANCE | Admitting: Cardiology

## 2012-01-19 VITALS — BP 148/65 | HR 50 | Ht 73.0 in | Wt 255.0 lb

## 2012-01-19 DIAGNOSIS — I2510 Atherosclerotic heart disease of native coronary artery without angina pectoris: Secondary | ICD-10-CM

## 2012-01-19 DIAGNOSIS — D509 Iron deficiency anemia, unspecified: Secondary | ICD-10-CM

## 2012-01-19 DIAGNOSIS — I739 Peripheral vascular disease, unspecified: Secondary | ICD-10-CM

## 2012-01-19 DIAGNOSIS — Z89619 Acquired absence of unspecified leg above knee: Secondary | ICD-10-CM

## 2012-01-19 DIAGNOSIS — N189 Chronic kidney disease, unspecified: Secondary | ICD-10-CM

## 2012-01-19 DIAGNOSIS — Z951 Presence of aortocoronary bypass graft: Secondary | ICD-10-CM | POA: Insufficient documentation

## 2012-01-19 DIAGNOSIS — R0989 Other specified symptoms and signs involving the circulatory and respiratory systems: Secondary | ICD-10-CM

## 2012-01-19 DIAGNOSIS — I771 Stricture of artery: Secondary | ICD-10-CM

## 2012-01-19 DIAGNOSIS — I4891 Unspecified atrial fibrillation: Secondary | ICD-10-CM

## 2012-01-19 DIAGNOSIS — I708 Atherosclerosis of other arteries: Secondary | ICD-10-CM

## 2012-01-19 DIAGNOSIS — E785 Hyperlipidemia, unspecified: Secondary | ICD-10-CM

## 2012-01-19 DIAGNOSIS — N289 Disorder of kidney and ureter, unspecified: Secondary | ICD-10-CM

## 2012-01-19 DIAGNOSIS — S78119A Complete traumatic amputation at level between unspecified hip and knee, initial encounter: Secondary | ICD-10-CM

## 2012-01-19 DIAGNOSIS — S25809A Unspecified injury of other blood vessels of thorax, unspecified side, initial encounter: Secondary | ICD-10-CM

## 2012-01-19 DIAGNOSIS — I10 Essential (primary) hypertension: Secondary | ICD-10-CM

## 2012-01-19 DIAGNOSIS — IMO0002 Reserved for concepts with insufficient information to code with codable children: Secondary | ICD-10-CM

## 2012-01-19 DIAGNOSIS — K922 Gastrointestinal hemorrhage, unspecified: Secondary | ICD-10-CM

## 2012-01-19 DIAGNOSIS — R9430 Abnormal result of cardiovascular function study, unspecified: Secondary | ICD-10-CM

## 2012-01-19 NOTE — Assessment & Plan Note (Signed)
The patient is recovering after rehabilitation from his left AKA. He is done well with rehabilitation.

## 2012-01-19 NOTE — Assessment & Plan Note (Signed)
His blood pressure is mildly elevated today. He says that it is always normal at home. I've chosen not to change his meds any further at this point.

## 2012-01-19 NOTE — Assessment & Plan Note (Addendum)
When the patient had an MRA of the chest in March, 2013, there was no aortic aneurysm. However there was suggestion of some stenosis of the subclavian. He has no symptoms. There is only very slight blood pressure differential. It is now time to arrange for very careful carotid and subclavian Doppler. I will arrange this and be in touch with the patient and his wife.  As part of today's evaluation I spent an extensive amount time reassessing all records in looking at old studies. I've also had very careful discussion with the patient and his wife. I spent greater than 25 minutes total. I spent more than half a that time was full discussion with them about multiple issues.

## 2012-01-19 NOTE — Assessment & Plan Note (Signed)
Coronary disease is stable. We know that his vein grafts are occluded. LIMA  to the LAD is patent. We will be very careful with the question of possible subclavian stenosis as it relates to his LIMA function.

## 2012-01-19 NOTE — Patient Instructions (Addendum)
Your physician recommends that you schedule a follow-up appointment in: 6 months. You will receive a reminder letter in the mail in about 4 months reminding you to call and schedule your appointment. If you don't receive this letter, please contact our office.  Your physician recommends that you continue on your current medications as directed. Please refer to the Current Medication list given to you today.  Your physician has requested that you regularly monitor and record your blood pressure readings at home. Please use the same machine at the same time of day to check your readings and record them to bring to your follow-up visit. Use your right arm only for these readings. Dr. Myrtis Ser will be back in contact with you about your Left carotid subclavian doppler.

## 2012-01-19 NOTE — Assessment & Plan Note (Signed)
Historically he does have some LV dysfunction. I will decide about medication changes and further workup over time.

## 2012-01-19 NOTE — Progress Notes (Signed)
Patient ID: Benjamin [name], male   DOB: 11-08-1933, 79 y.o.   MRN: [id number]   HPI  The patient is here for cardiology followup. He is here with his wife [name], who I know very well from working with her in the hospital over the years. She is made it clear today that she would like for me to be his cardiologist going forward. He is stable. He has significant coronary disease. He is post CABG. His grafts are occluded but his LIMA to the LAD is patent. Historically he's had an ejection fraction in the 40-45% range. He had severe vascular disease of his left leg. There was no good revascularization option. He had amputation of his left leg and has gone through several months of rehabilitation and is now doing very well. With significant rehabilitation he is not having any chest pain shortness of breath syncope or presyncope. In addition he uses his arms and does not get any arm discomfort or CNS symptoms.  Patient had an MRA to evaluate for possible aortic aneurysm. There was no aneurysm but there was suggestion of subclavian stenosis. His left radial pulse is decreased compared to the right. His blood pressure was equal in both arms in the office in March, 2013. The plan was to evaluate this issue further after he had his amputation.  Allergies  Allergen Reactions  . Penicillins     REACTION: rash  . Percocet (Oxycodone-Acetaminophen) Nausea And Vomiting    Current Outpatient Prescriptions  Medication Sig Dispense Refill  . amLODipine (NORVASC) 5 MG tablet Take 5 mg by mouth daily.      Marland Kitchen aspirin 81 MG tablet Take 81 mg by mouth daily.        . carvedilol (COREG) 3.125 MG tablet Take 3.125 mg by mouth 2 (two) times daily.      . clopidogrel (PLAVIX) 75 MG tablet Take 75 mg by mouth daily.      Marland Kitchen doxazosin (CARDURA) 4 MG tablet Take 4 mg by mouth 2 (two) times daily.      . ferrous sulfate 325 (65 FE) MG tablet Take 1 tablet (325 mg total) by mouth 3 (three) times daily with meals.      .  gabapentin (NEURONTIN) 300 MG capsule Take 300 mg by mouth 3 (three) times daily.      Marland Kitchen glipiZIDE (GLUCOTROL XL) 10 MG 24 hr tablet Take 10 mg by mouth daily.      . hydrochlorothiazide (HYDRODIURIL) 25 MG tablet Take 25 mg by mouth daily.      Marland Kitchen ibuprofen (ADVIL,MOTRIN) 200 MG tablet Take 400 mg by mouth every 6 (six) hours as needed.      . isosorbide mononitrate (IMDUR) 30 MG 24 hr tablet Take 30 mg by mouth daily with breakfast.       . L-Methylfolate-B6-B12 (METANX) 3-35-2 MG TABS Take 1 tablet by mouth daily.        . metFORMIN (GLUCOPHAGE) 500 MG tablet Take 500 mg by mouth 2 (two) times daily as needed. For diabetes. If cbg is greater than 150.      . Multiple Vitamin (MULITIVITAMIN WITH MINERALS) TABS Take 1 tablet by mouth daily.      . nitroGLYCERIN (NITROSTAT) 0.4 MG SL tablet Place 0.4 mg under the tongue every 5 (five) minutes as needed. For chest pain      . pantoprazole (PROTONIX) 40 MG tablet Take 40 mg by mouth at bedtime.       . pravastatin (PRAVACHOL)  80 MG tablet Take 80 mg by mouth at bedtime.       . traMADol (ULTRAM) 50 MG tablet Take 50 mg by mouth every 6 (six) hours as needed. For pain        History   Social History  . Marital Status: Married    Spouse Name: N/A    Number of Children: N/A  . Years of Education: N/A   Occupational History  . Not on file.   Social History Main Topics  . Smoking status: Never Smoker   . Smokeless tobacco: Never Used  . Alcohol Use: No  . Drug Use: No  . Sexually Active: Not on file   Other Topics Concern  . Not on file   Social History Narrative  . No narrative on file    Family History  Problem Relation Age of Onset  . Heart disease Father   . Coronary artery disease Mother   . Coronary artery disease Brother     Past Medical History  Diagnosis Date  . CAD (coronary artery disease)     Severe native, Statuspost coronary bypass grafting 2004 with a I to the LAD and the sac was vein graft obtuse marginal and  a  second vein graft to the diagonal. Preserved LV function with an ejection fraction of 55%  . Upper GI bleeding     history of upper GI bleeding status pst EGD and colonoscopy. Anicoagulation with aspirin and plavix  . Iron deficiency anemia     Secondary to GI bleed improved with current therapy  . Diabetes mellitus   . Hypertension   . Obesity   . DJD (degenerative joint disease)   . Internal mammary artery injury     Continued patency of the internal mammary to the LAD, occlusion of the saphenous vein graft to the diagonal, severe multiple high-grade lesions with large clot burder in saphenous vein graft to the OM with small distal target, and high-grade in-stent restenosis  . Atrial fibrillation     History of postoperative atrial fibrillation  . Chronic renal insufficiency     rather than baseline 1.56  . GERD (gastroesophageal reflux disease)   . Peripheral vascular disease     s/p arteriogram by Dr. Excell Seltzer, bilaterally  . Hyperlipidemia   . Pneumonia     history 2003    Past Surgical History  Procedure Date  . Coronary stents     three  . Lumbar fusions   . Laminectomy   . Anterior cervical discectomy   . Cardiac catheterization     2006  . Coronary artery bypass graft     stents x 3  . I&d extremity 04/07/2011    Procedure: IRRIGATION AND DEBRIDEMENT EXTREMITY;  Surgeon: Nadara Mustard, MD;  Location: [facility] OR;  Service: Orthopedics;  Laterality: Left;  Left Partial Calcaneal Excision, Place Antibiotic Beads  . Amputation 08/12/2011    Procedure: AMPUTATION BELOW KNEE;  Surgeon: Nadara Mustard, MD;  Location: [facility] OR;  Service: Orthopedics;  Laterality: Left;  Left Below Knee Amputation    Patient Active Problem List  Diagnosis  . AODM  . DYSLIPIDEMIA  . ANEMIA  . ESSENTIAL HYPERTENSION, BENIGN  . MYOCARDIAL INFARCTION, HX OF  . CORONARY ATHEROSCLEROSIS NATIVE CORONARY ARTERY  . CORONARY ARTERY DISEASE, S/P PTCA  . LEFT VENTRICULAR FUNCTION, DECREASED  . RENAL  FAILURE, CHRONIC  . OTH SPEC D/O RESULT FROM IMPAIRED RENAL FUNCTION  . CAROTID BRUIT, RIGHT  . SHORTNESS OF BREATH  .  OTH NONSPECIFIC ABNORM CV SYSTEM FUNCTION STUDY  . GASTROINTESTINAL HEMORRHAGE, HX OF  . CORONARY ARTERY BYPASS GRAFT, HX OF  . Atherosclerosis of native arteries of the extremities with ulceration  . Ascending aortic aneurysm  . Preop cardiovascular exam  . Subclavian artery stenosis, left    ROS   Patient denies fever, chills, headache, sweats, rash, change in vision, change in hearing, chest pain, cough, nausea vomiting, urinary symptoms. All other systems are reviewed and are negative.  PHYSICAL EXAM   Patient is in a wheelchair. He is quite stable. His wife is with him in the room. There is no jugulovenous distention. Lungs are clear. Respiratory effort is nonlabored. Cardiac exam reveals S1 and S2. There no clicks or significant murmurs. The abdomen is soft. There is no peripheral edema. I did not examine the stump of his left leg. He has trace peripheral edema in the right ankle. Blood pressure was rechecked in both arms. Right arm was 155/64 in the left arm 148/65. There is slight decrease pulse in the left radial when compared to the right. There are no skin rashes.  Filed Vitals:   01/19/12 1351 01/19/12 1427 01/19/12 1428  BP: 176/69 155/64 148/65  Pulse: 52 49 50  Height: 6\' 1"  (1.854 m)    Weight: 255 lb (115.667 kg)       ASSESSMENT & PLAN

## 2012-01-19 NOTE — Assessment & Plan Note (Signed)
Doppler, August, 2012, no significant carotid disease.

## 2012-01-20 ENCOUNTER — Other Ambulatory Visit: Payer: Self-pay | Admitting: Cardiology

## 2012-01-24 ENCOUNTER — Other Ambulatory Visit: Payer: Self-pay | Admitting: Cardiology

## 2012-01-24 MED ORDER — PRAVASTATIN SODIUM 80 MG PO TABS: 80.0000 mg | ORAL_TABLET | Freq: Every day | ORAL | Status: DC

## 2012-01-25 ENCOUNTER — Telehealth: Payer: Self-pay | Admitting: Cardiology

## 2012-01-25 NOTE — Telephone Encounter (Signed)
Spoke with wife and she stated that patient's BP has been running high since he was here. Systolic ranging from 170's-180's. Patient is using the right arm. No c/o pain, sob or dizziness.

## 2012-01-25 NOTE — Telephone Encounter (Signed)
Mrs. Giangregorio called in regards to Benjamin [name] blood pressure. States that it is elevated. Running 180/60-170/68.  Concerned that he might need to be on another medication.

## 2012-01-30 NOTE — Telephone Encounter (Signed)
Increase amlodipine to 10 mg daily

## 2012-01-31 ENCOUNTER — Other Ambulatory Visit: Payer: Self-pay | Admitting: Cardiology

## 2012-01-31 MED ORDER — AMLODIPINE BESYLATE 5 MG PO TABS: 10.0000 mg | ORAL_TABLET | Freq: Every day | ORAL | Status: DC

## 2012-01-31 NOTE — Telephone Encounter (Signed)
Informed pt wife [name] to increase amlodipine to 10 Mg once daily. Asked if she needed [place] to send pharmacy a refill request and she stated that she would call us when she needed [place] to do so.

## 2012-01-31 NOTE — Addendum Note (Signed)
Addended by: Burnice Logan on: 01/31/2012 11:58 AM   Modules accepted: Orders

## 2012-02-17 ENCOUNTER — Other Ambulatory Visit: Payer: Self-pay | Admitting: Cardiology

## 2012-02-17 MED ORDER — AMLODIPINE BESYLATE 10 MG PO TABS: 10.0000 mg | ORAL_TABLET | Freq: Every day | ORAL | Status: DC

## 2012-02-17 NOTE — Telephone Encounter (Signed)
Called in Amlodipine 10 MG once daily for dose increase for pt.

## 2012-03-02 ENCOUNTER — Encounter (INDEPENDENT_AMBULATORY_CARE_PROVIDER_SITE_OTHER): Payer: PRIVATE HEALTH INSURANCE

## 2012-03-02 DIAGNOSIS — I771 Stricture of artery: Secondary | ICD-10-CM

## 2012-03-10 ENCOUNTER — Telehealth: Payer: Self-pay | Admitting: *Deleted

## 2012-03-10 NOTE — Telephone Encounter (Signed)
Wife called to inform our office that patient has been prescribed a nitro 0.2 mg patch for his non healing ulcer changing every 12 hours prescribed by Dr. Lajoyce Corners. Wife wants to know is this safe for him to use with his heart history. Please advise.

## 2012-03-10 NOTE — Telephone Encounter (Signed)
Ok to proceed, provided it doesn't significantly lower his BP.

## 2012-03-10 NOTE — Telephone Encounter (Signed)
Wife informed and verbalized understanding

## 2012-03-17 ENCOUNTER — Encounter: Payer: Self-pay | Admitting: Cardiology

## 2012-03-17 NOTE — Progress Notes (Unsigned)
   When I saw the patient in January, 2014 plans were made for him to have a followup carotid Dopplers with very careful attention to his subclavian artery. This was scheduled. There were many changes related to the weather. Eventually the study was done. Unfortunately we did not obtain enough information concerning the subclavian. I've spoken with Missy about this and I have spoken with the patient's wife. Will arrange for the patient to come to the [facility] office for further evaluation with no charges. Missy will work out the exact timing. It appears that the patient is coming to [facility] on March 12 for a different doctor appointment. I am hopeful that he can be worked out on that day.Jerral Bonito, MD

## 2012-03-21 ENCOUNTER — Telehealth: Payer: Self-pay | Admitting: *Deleted

## 2012-03-21 ENCOUNTER — Telehealth: Payer: Self-pay | Admitting: Cardiology

## 2012-03-21 NOTE — Telephone Encounter (Signed)
Message copied by Eustace Moore on Tue Mar 21, 2012  1:20 PM ------      Message from: Myrtis Ser, [place] D      Created: Tue Mar 14, 2012  6:09 PM       Please call and check with the patient concerning his blood pressure. Please be sure to talk with the patient's wife. Also please let them know that I am still following up the results of the carotid Doppler. I will be in touch with them when I have reviewed it further. ------

## 2012-03-21 NOTE — Telephone Encounter (Signed)
Wife informed. Wife awaiting call back from vascular lab tech r/e further testing of subclavian arteries.

## 2012-03-21 NOTE — Telephone Encounter (Signed)
Patient wife informed

## 2012-03-21 NOTE — Telephone Encounter (Signed)
Pt wife is returning call from 03/20/12/tmj

## 2012-04-13 ENCOUNTER — Encounter (INDEPENDENT_AMBULATORY_CARE_PROVIDER_SITE_OTHER): Payer: PRIVATE HEALTH INSURANCE

## 2012-04-13 DIAGNOSIS — R0989 Other specified symptoms and signs involving the circulatory and respiratory systems: Secondary | ICD-10-CM

## 2012-04-13 DIAGNOSIS — I6529 Occlusion and stenosis of unspecified carotid artery: Secondary | ICD-10-CM

## 2012-04-20 ENCOUNTER — Telehealth: Payer: Self-pay | Admitting: Cardiology

## 2012-04-20 MED ORDER — HYDROCHLOROTHIAZIDE 25 MG PO TABS: 25.0000 mg | ORAL_TABLET | Freq: Every day | ORAL | Status: DC

## 2012-04-20 NOTE — Telephone Encounter (Signed)
needs refill hydrochlorothiazide (HYDRODIURIL) 25 MG tablet

## 2012-04-29 ENCOUNTER — Encounter: Payer: Self-pay | Admitting: Cardiology

## 2012-04-29 DIAGNOSIS — I739 Peripheral vascular disease, unspecified: Secondary | ICD-10-CM

## 2012-04-29 DIAGNOSIS — I779 Disorder of arteries and arterioles, unspecified: Secondary | ICD-10-CM | POA: Insufficient documentation

## 2012-05-01 ENCOUNTER — Telehealth: Payer: Self-pay | Admitting: *Deleted

## 2012-05-01 NOTE — Telephone Encounter (Signed)
Message copied by Eustace Moore on Mon May 01, 2012  9:54 AM ------      Message from: [place], [place] D      Created: Sat Apr 29, 2012 10:09 AM       Please be in touch with the patient's wife who we know well from [employer]. Let her know that I am pleased that the followup portion of the carotid Doppler studies shows that the patient's subclavian artery is fine ------

## 2012-05-01 NOTE — Telephone Encounter (Signed)
Wife informed

## 2012-05-16 ENCOUNTER — Other Ambulatory Visit: Payer: Self-pay | Admitting: Cardiology

## 2012-06-05 ENCOUNTER — Other Ambulatory Visit: Payer: Self-pay | Admitting: Physician Assistant

## 2012-08-18 ENCOUNTER — Other Ambulatory Visit: Payer: Self-pay | Admitting: Cardiology

## 2012-08-21 ENCOUNTER — Other Ambulatory Visit: Payer: Self-pay | Admitting: Cardiology

## 2012-09-13 ENCOUNTER — Other Ambulatory Visit: Payer: Self-pay | Admitting: Physician Assistant

## 2012-10-19 ENCOUNTER — Ambulatory Visit: Payer: PRIVATE HEALTH INSURANCE | Admitting: Cardiology

## 2012-10-19 ENCOUNTER — Ambulatory Visit (INDEPENDENT_AMBULATORY_CARE_PROVIDER_SITE_OTHER): Payer: PRIVATE HEALTH INSURANCE | Admitting: Cardiology

## 2012-10-19 ENCOUNTER — Encounter: Payer: Self-pay | Admitting: Cardiology

## 2012-10-19 VITALS — BP 186/63 | HR 51 | Ht 73.0 in | Wt 280.0 lb

## 2012-10-19 DIAGNOSIS — R0602 Shortness of breath: Secondary | ICD-10-CM

## 2012-10-19 DIAGNOSIS — R001 Bradycardia, unspecified: Secondary | ICD-10-CM | POA: Insufficient documentation

## 2012-10-19 DIAGNOSIS — I498 Other specified cardiac arrhythmias: Secondary | ICD-10-CM

## 2012-10-19 DIAGNOSIS — I4891 Unspecified atrial fibrillation: Secondary | ICD-10-CM

## 2012-10-19 DIAGNOSIS — I779 Disorder of arteries and arterioles, unspecified: Secondary | ICD-10-CM

## 2012-10-19 DIAGNOSIS — I10 Essential (primary) hypertension: Secondary | ICD-10-CM

## 2012-10-19 DIAGNOSIS — I708 Atherosclerosis of other arteries: Secondary | ICD-10-CM

## 2012-10-19 DIAGNOSIS — I771 Stricture of artery: Secondary | ICD-10-CM

## 2012-10-19 DIAGNOSIS — E785 Hyperlipidemia, unspecified: Secondary | ICD-10-CM

## 2012-10-19 DIAGNOSIS — I2510 Atherosclerotic heart disease of native coronary artery without angina pectoris: Secondary | ICD-10-CM

## 2012-10-19 NOTE — Assessment & Plan Note (Signed)
The patient's wife is very liable. She takes his pressure at home. His systolic is in the range of 145. No change in therapy.

## 2012-10-19 NOTE — Assessment & Plan Note (Signed)
He is not having any significant shortness of breath. 

## 2012-10-19 NOTE — Assessment & Plan Note (Signed)
Very careful followup Doppler directed at the subclavian artery in April, 2014 reveals no significant subclavian stenosis. No further workup.

## 2012-10-19 NOTE — Assessment & Plan Note (Signed)
Coronary disease is stable at this time. No further workup. 

## 2012-10-19 NOTE — Assessment & Plan Note (Addendum)
Patient has mild asymptomatic sinus bradycardia today. He is on only a very small dose of carvedilol. No change in therapy.. No change in therapy.

## 2012-10-19 NOTE — Patient Instructions (Signed)

## 2012-10-19 NOTE — Progress Notes (Signed)
HPI  Patient is seen today to followup coronary disease. He is doing well. I saw him last January, 2014. At that time plans were made to followup his carotid disease. There had been question over time about subclavian stenosis. He had a followup Doppler that was very carefully done and it showed no subclavian stenosis. He's doing well. He continues to improve with his left leg amputation. He's not having any chest pain or shortness of breath. His blood pressure is normal at home.  Allergies  Allergen Reactions  . Penicillins     REACTION: rash  . Percocet [Oxycodone-Acetaminophen] Nausea And Vomiting    Current Outpatient Prescriptions  Medication Sig Dispense Refill  . amLODipine (NORVASC) 10 MG tablet Take 1 tablet (10 mg total) by mouth daily.  30 tablet  3  . aspirin 81 MG tablet Take 81 mg by mouth daily.        . carvedilol (COREG) 3.125 MG tablet Take 3.125 mg by mouth 2 (two) times daily.      . clopidogrel (PLAVIX) 75 MG tablet Take 75 mg by mouth daily.      Marland Kitchen doxazosin (CARDURA) 4 MG tablet TAKE ONE TABLET BY MOUTH TWICE DAILY  60 tablet  6  . ferrous sulfate 325 (65 FE) MG tablet Take 1 tablet (325 mg total) by mouth 3 (three) times daily with meals.      . gabapentin (NEURONTIN) 300 MG capsule Take 300 mg by mouth 3 (three) times daily.      Marland Kitchen glipiZIDE (GLUCOTROL XL) 10 MG 24 hr tablet Take 10 mg by mouth daily.      . hydrochlorothiazide (HYDRODIURIL) 25 MG tablet TAKE 1 TABLET BY MOUTH EVERY DAY  30 tablet  2  . ibuprofen (ADVIL,MOTRIN) 200 MG tablet Take 400 mg by mouth every 6 (six) hours as needed.      . isosorbide mononitrate (IMDUR) 30 MG 24 hr tablet Take 30 mg by mouth daily with breakfast.       . L-Methylfolate-B6-B12 (METANX) 3-35-2 MG TABS Take 1 tablet by mouth daily.        . metFORMIN (GLUCOPHAGE) 500 MG tablet Take 500 mg by mouth 2 (two) times daily as needed. For diabetes. If cbg is greater than 150.      . Multiple Vitamin (MULITIVITAMIN WITH MINERALS)  TABS Take 1 tablet by mouth daily.      . nitroGLYCERIN (NITROSTAT) 0.4 MG SL tablet Place 0.4 mg under the tongue every 5 (five) minutes as needed. For chest pain      . pantoprazole (PROTONIX) 40 MG tablet Take 40 mg by mouth at bedtime.       . pravastatin (PRAVACHOL) 80 MG tablet TAKE 1 TABLET BY MOUTH AT BEDTIME  30 tablet  6   No current facility-administered medications for this visit.    History   Social History  . Marital Status: Married    Spouse Name: N/A    Number of Children: N/A  . Years of Education: N/A   Occupational History  . Not on file.   Social History Main Topics  . Smoking status: Never Smoker   . Smokeless tobacco: Never Used  . Alcohol Use: No  . Drug Use: No  . Sexual Activity: Not on file   Other Topics Concern  . Not on file   Social History Narrative  . No narrative on file    Family History  Problem Relation Age of Onset  . Heart disease Father   .  Coronary artery disease Mother   . Coronary artery disease Brother     Past Medical History  Diagnosis Date  . CAD (coronary artery disease)     Severe native, Statuspost coronary bypass grafting 2004 with a I to the LAD and the sac was vein graft obtuse marginal and a  second vein graft to the diagonal. Preserved LV function with an ejection fraction of 55%  . Upper GI bleeding     history of upper GI bleeding status pst EGD and colonoscopy. Anicoagulation with aspirin and plavix  . Iron deficiency anemia     Secondary to GI bleed improved with current therapy  . Diabetes mellitus   . Hypertension   . Obesity   . DJD (degenerative joint disease)   . Internal mammary artery injury     Continued patency of the internal mammary to the LAD, occlusion of the saphenous vein graft to the diagonal, severe multiple high-grade lesions with large clot burder in saphenous vein graft to the OM with small distal target, and high-grade in-stent restenosis  . Atrial fibrillation     History of  postoperative atrial fibrillation  . Chronic renal insufficiency     rather than baseline 1.56  . GERD (gastroesophageal reflux disease)   . Peripheral vascular disease     s/p arteriogram by Dr. Excell Seltzer, bilaterally  . Hyperlipidemia   . Pneumonia     history 2003  . S/P AKA (above knee amputation) unilateral     2013  . Hx of CABG     CABG, 2004  . Ejection fraction     EF 40-45%, echo, August, 2012  . Carotid artery disease     Doppler, 2010, no significant disease  //  Doppler, February, 2 01/14/1938 to 59% R. ICA,  0-39% LICA, plan followup in one year   ( followup limited study  to assess subclavian  showed no  subclavian stenosis April, 2014    Past Surgical History  Procedure Laterality Date  . Coronary stents      three  . Lumbar fusions    . Laminectomy    . Anterior cervical discectomy    . Cardiac catheterization      2006  . Coronary artery bypass graft      stents x 3  . I&d extremity  04/07/2011    Procedure: IRRIGATION AND DEBRIDEMENT EXTREMITY;  Surgeon: Nadara Mustard, MD;  Location: [facility] OR;  Service: Orthopedics;  Laterality: Left;  Left Partial Calcaneal Excision, Place Antibiotic Beads  . Amputation  08/12/2011    Procedure: AMPUTATION BELOW KNEE;  Surgeon: Nadara Mustard, MD;  Location: [facility] OR;  Service: Orthopedics;  Laterality: Left;  Left Below Knee Amputation    Patient Active Problem List   Diagnosis Date Noted  . Carotid artery disease   . Upper GI bleeding   . Iron deficiency anemia   . Hypertension   . Atrial fibrillation   . Chronic renal insufficiency   . Peripheral vascular disease   . Hyperlipidemia   . S/P AKA (above knee amputation) unilateral   . Hx of CABG   . CAD (coronary artery disease)   . Ejection fraction   . Internal mammary artery injury   . Subclavian artery stenosis, left 03/26/2011  . SHORTNESS OF BREATH 05/12/2009  . AODM 12/05/2007    ROS   Patient denies fever, chills, headache, sweats, rash, change in vision, change  in hearing, chest pain, cough, nausea vomiting, urinary symptoms. All other  systems are reviewed and are negative.  PHYSICAL EXAM  Patient his here with his wife who we know very well. He is oriented to person time and place. Affect is normal. There is no jugulovenous distention. Lungs are clear. Respiratory effort is not labored. He is overweight. Cardiac exam reveals S1 and S2. There no clicks or significant murmurs. The abdomen is soft. There is no peripheral edema. He has a left leg amputation. There is a temporary prosthesis in place. There is no edema in the other ankle.  Filed Vitals:   10/19/12 1349  BP: 186/63  Pulse: 51  Height: 6\' 1"  (1.854 m)  Weight: 280 lb (127.007 kg)   EKG is done today and reviewed by me. There is sinus bradycardia. There are old nonspecific ST-T wave changes. There is no change from the past.  ASSESSMENT & PLAN

## 2012-10-19 NOTE — Assessment & Plan Note (Signed)
With his known coronary disease he would be optimal to have him on either atorvastatin or Crestor. The patient had significant muscle aches with atorvastatin in the past. He tolerates Pravachol well. I've chosen not to change his Pravachol.

## 2012-11-17 ENCOUNTER — Other Ambulatory Visit: Payer: Self-pay | Admitting: Cardiology

## 2012-11-17 ENCOUNTER — Other Ambulatory Visit: Payer: Self-pay | Admitting: Physician Assistant

## 2012-11-24 ENCOUNTER — Other Ambulatory Visit: Payer: Self-pay | Admitting: Cardiology

## 2012-12-08 ENCOUNTER — Other Ambulatory Visit: Payer: Self-pay | Admitting: Cardiology

## 2012-12-11 IMAGING — CR DG CHEST 2V
2 series · 2 of 2 positions shown · non-contrast
Comparison: 05/06/2004

CLINICAL DATA: Preoperative chest x-ray for peripheral vascular
disease

CHEST - 2 VIEW

[view not recorded (1 of 2)]
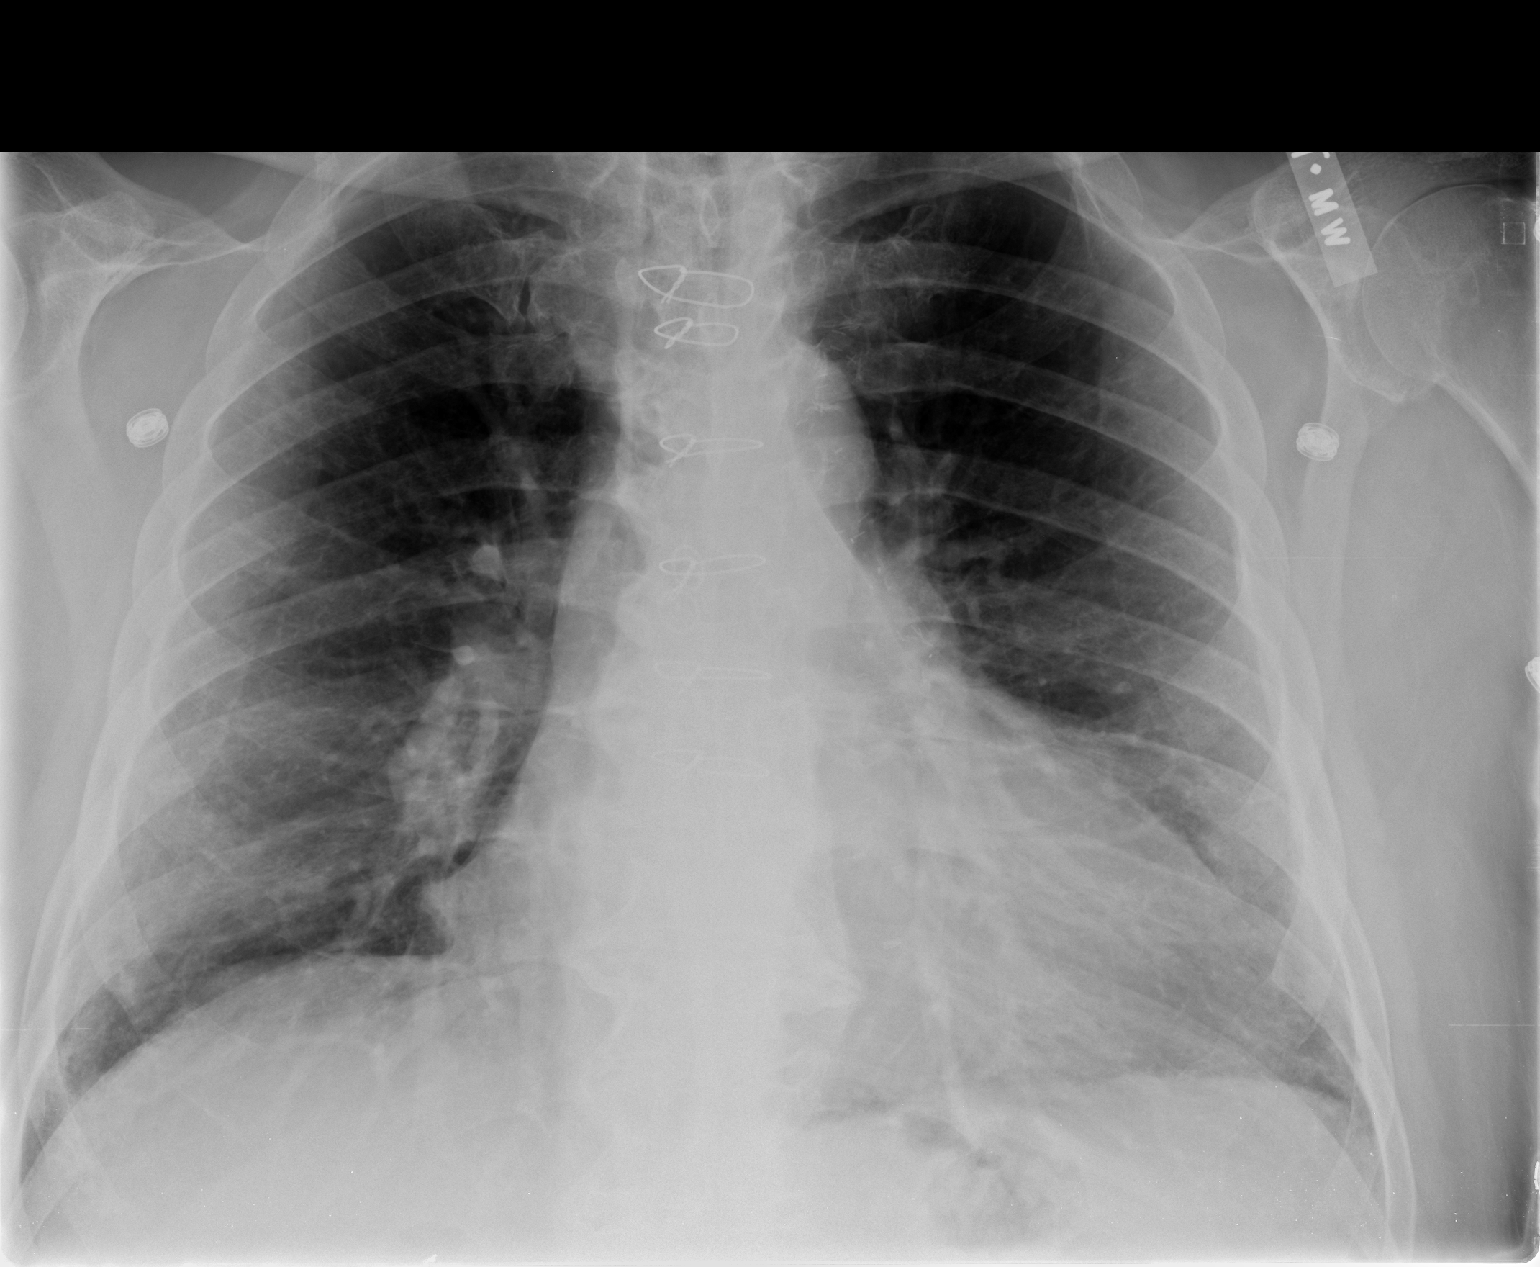

[view not recorded (2 of 2)]
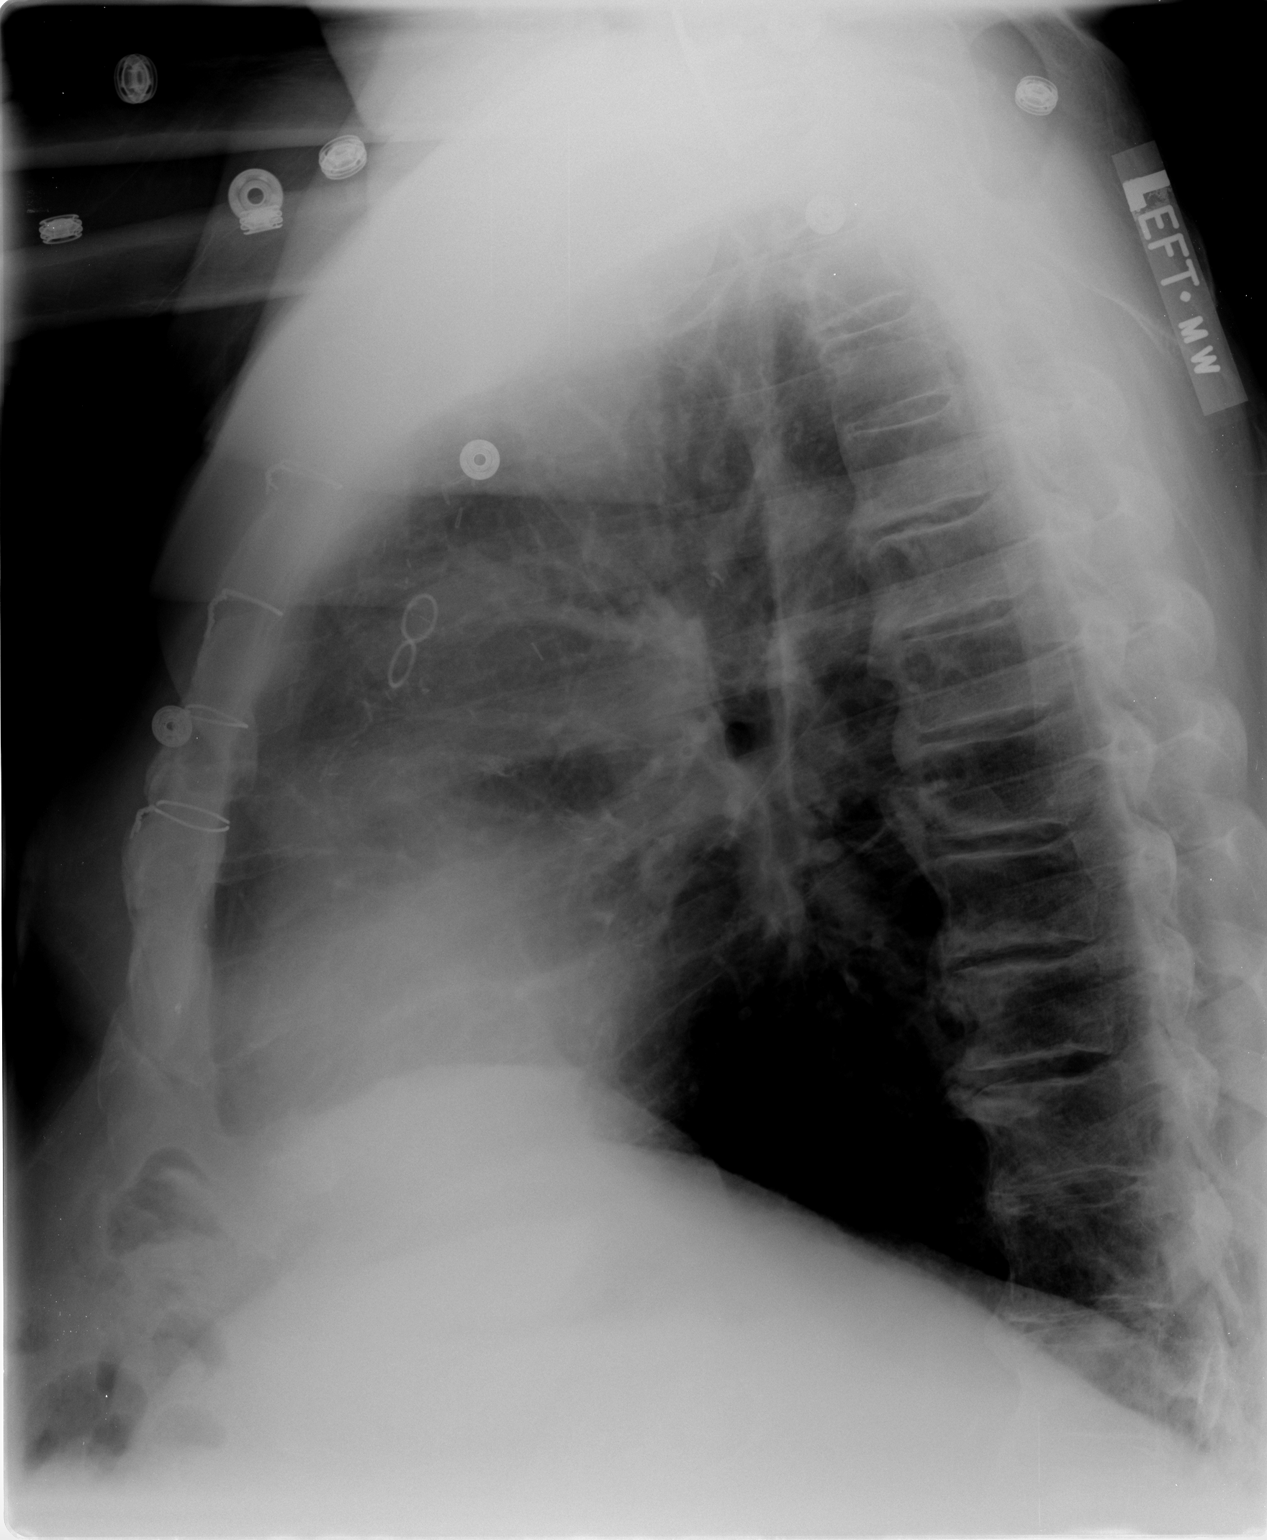

[2 of 2 positions shown; findings below may reference images not displayed]

FINDINGS: Post CABG.  Heart mildly enlarged without congestive
heart failure or pleural fluid.  The lungs are hyperaerated with
central airway thickening.  Osseous structures intact with moderate
degenerative changes of the T-spine.
IMPRESSION: Cardiomegaly, chronic lung changes, and prior CABG.  Currently no
congestive heart failure or active disease.

## 2012-12-18 ENCOUNTER — Other Ambulatory Visit: Payer: Self-pay | Admitting: *Deleted

## 2012-12-18 ENCOUNTER — Other Ambulatory Visit: Payer: Self-pay | Admitting: Cardiology

## 2012-12-18 MED ORDER — AMLODIPINE BESYLATE 10 MG PO TABS: 10.0000 mg | ORAL_TABLET | Freq: Every day | ORAL | Status: DC

## 2012-12-18 MED ORDER — ISOSORBIDE MONONITRATE ER 30 MG PO TB24: 30.0000 mg | ORAL_TABLET | Freq: Every day | ORAL | Status: DC

## 2013-01-26 ENCOUNTER — Other Ambulatory Visit: Payer: Self-pay | Admitting: Cardiology

## 2013-01-26 ENCOUNTER — Telehealth: Payer: Self-pay | Admitting: Cardiology

## 2013-01-26 MED ORDER — PRAVASTATIN SODIUM 80 MG PO TABS: 80.0000 mg | ORAL_TABLET | Freq: Every evening | ORAL | Status: DC

## 2013-01-26 NOTE — Telephone Encounter (Signed)
DONE

## 2013-02-06 ENCOUNTER — Other Ambulatory Visit: Payer: Self-pay | Admitting: Cardiology

## 2013-02-06 ENCOUNTER — Other Ambulatory Visit: Payer: Self-pay | Admitting: Physician Assistant

## 2013-02-20 ENCOUNTER — Telehealth: Payer: Self-pay | Admitting: Medical Oncology

## 2013-02-26 NOTE — Telephone Encounter (Signed)
1 

## 2013-03-05 ENCOUNTER — Other Ambulatory Visit: Payer: Self-pay | Admitting: *Deleted

## 2013-03-05 DIAGNOSIS — I779 Disorder of arteries and arterioles, unspecified: Secondary | ICD-10-CM

## 2013-03-05 DIAGNOSIS — I739 Peripheral vascular disease, unspecified: Principal | ICD-10-CM

## 2013-03-23 ENCOUNTER — Other Ambulatory Visit: Payer: Self-pay | Admitting: Cardiology

## 2013-04-23 ENCOUNTER — Other Ambulatory Visit: Payer: Self-pay | Admitting: *Deleted

## 2013-04-23 MED ORDER — PRAVASTATIN SODIUM 80 MG PO TABS: 80.0000 mg | ORAL_TABLET | Freq: Every day | ORAL | Status: DC

## 2013-05-09 ENCOUNTER — Ambulatory Visit: Payer: PRIVATE HEALTH INSURANCE | Admitting: Cardiology

## 2013-06-08 ENCOUNTER — Encounter: Payer: Self-pay | Admitting: Cardiology

## 2013-06-11 ENCOUNTER — Telehealth: Payer: Self-pay | Admitting: Cardiology

## 2013-06-11 NOTE — Telephone Encounter (Signed)
Received a note from Audrea Muscat stating that she thinks Benjamin [name] is not feeling well these days. States that she thinks he might be depressed but will not admit to that. She was wanting to know if Dr. Ron Parker would consider doing another echo on him.

## 2013-06-13 NOTE — Telephone Encounter (Signed)
Spoke with wife and informed her that during the upcoming office visit, she can discuss with MD whether or not patient will need an echo. Wife informed nurse that patient was seen at urgent care this past Friday and dx with bronchitis. She said patient still isn't doing well and would like to cancel appointment for Friday and she would call back to reschedule it.

## 2013-06-13 NOTE — Telephone Encounter (Signed)
Spoke with Jenny Reichmann at Urgent Care and requested recent office note, and test ect.

## 2013-06-15 ENCOUNTER — Ambulatory Visit: Payer: PRIVATE HEALTH INSURANCE | Admitting: Cardiology

## 2013-06-18 ENCOUNTER — Other Ambulatory Visit: Payer: Self-pay | Admitting: Medical Oncology

## 2013-07-16 ENCOUNTER — Ambulatory Visit (INDEPENDENT_AMBULATORY_CARE_PROVIDER_SITE_OTHER): Payer: PRIVATE HEALTH INSURANCE | Admitting: Cardiology

## 2013-07-16 ENCOUNTER — Encounter: Payer: Self-pay | Admitting: Cardiology

## 2013-07-16 VITALS — BP 151/65 | HR 58 | Ht 73.0 in | Wt 205.0 lb

## 2013-07-16 DIAGNOSIS — I739 Peripheral vascular disease, unspecified: Secondary | ICD-10-CM

## 2013-07-16 DIAGNOSIS — I779 Disorder of arteries and arterioles, unspecified: Secondary | ICD-10-CM

## 2013-07-16 DIAGNOSIS — E785 Hyperlipidemia, unspecified: Secondary | ICD-10-CM

## 2013-07-16 DIAGNOSIS — I25810 Atherosclerosis of coronary artery bypass graft(s) without angina pectoris: Secondary | ICD-10-CM

## 2013-07-16 NOTE — Assessment & Plan Note (Signed)
He has moderate carotid disease. It is now time for a one-year followup. We know that he does not have subclavian stenosis.

## 2013-07-16 NOTE — Assessment & Plan Note (Signed)
We have talked in the past about using other statins. He cannot tolerate them. Therefore he remains on Pravachol

## 2013-07-16 NOTE — Assessment & Plan Note (Signed)
Coronary disease is stable. CABG 2004. Vein grafts occluded with the LIMA patent to the LAD at the most recent cath

## 2013-07-16 NOTE — Progress Notes (Signed)
Patient ID: Benjamin [name], male   DOB: April 08, 1933, 80 y.o.   MRN: [id number]    HPI  Patient is seen today to followup coronary disease. I saw him last October, 2014. Last year we had questions subclavian stenosis.. Careful Doppler followup revealed no subclavian stenosis. He does have moderate carotid disease and a followup study will be done. He continues to improve with the use of his left leg prosthesis. He had a recent pulmonary illness that persisted for several weeks but finally improved. He is not having any chest pain or shortness of breath.  Allergies  Allergen Reactions  . Penicillins     REACTION: rash  . Percocet [Oxycodone-Acetaminophen] Nausea And Vomiting    Current Outpatient Prescriptions  Medication Sig Dispense Refill  . amLODipine (NORVASC) 5 MG tablet Take 5 mg by mouth 2 (two) times daily.      Marland Kitchen aspirin 81 MG tablet Take 81 mg by mouth daily.        . carvedilol (COREG) 3.125 MG tablet Take 3.125 mg by mouth 2 (two) times daily.      . clopidogrel (PLAVIX) 75 MG tablet Take 75 mg by mouth daily.      Marland Kitchen doxazosin (CARDURA) 4 MG tablet TAKE 1 TABLET BY MOUTH TWICE DAILY  180 tablet  2  . ferrous sulfate 325 (65 FE) MG tablet Take 1 tablet (325 mg total) by mouth 3 (three) times daily with meals.      . gabapentin (NEURONTIN) 300 MG capsule Take 300 mg by mouth 3 (three) times daily.      Marland Kitchen glipiZIDE (GLUCOTROL XL) 10 MG 24 hr tablet Take 10 mg by mouth daily.      . hydrochlorothiazide (HYDRODIURIL) 25 MG tablet TAKE 1 TABLET BY MOUTH EVERY DAY  90 tablet  3  . ibuprofen (ADVIL,MOTRIN) 200 MG tablet Take 400 mg by mouth every 6 (six) hours as needed.      . isosorbide mononitrate (IMDUR) 30 MG 24 hr tablet Take 1 tablet (30 mg total) by mouth daily with breakfast.  90 tablet  3  . L-Methylfolate-B6-B12 (METANX) 3-35-2 MG TABS Take 1 tablet by mouth daily.        . metFORMIN (GLUCOPHAGE) 500 MG tablet Take 500 mg by mouth 2 (two) times daily with a meal. For diabetes.  If cbg is greater than 150.      . Multiple Vitamin (MULITIVITAMIN WITH MINERALS) TABS Take 1 tablet by mouth daily.      . nitroGLYCERIN (NITROSTAT) 0.4 MG SL tablet Place 0.4 mg under the tongue every 5 (five) minutes as needed. For chest pain      . pantoprazole (PROTONIX) 40 MG tablet Take 40 mg by mouth at bedtime.       . pravastatin (PRAVACHOL) 80 MG tablet Take 1 tablet (80 mg total) by mouth daily.  90 tablet  3   No current facility-administered medications for this visit.    History   Social History  . Marital Status: Married    Spouse Name: N/A    Number of Children: N/A  . Years of Education: N/A   Occupational History  . Not on file.   Social History Main Topics  . Smoking status: Never Smoker   . Smokeless tobacco: Never Used  . Alcohol Use: No  . Drug Use: No  . Sexual Activity: Not on file   Other Topics Concern  . Not on file   Social History Narrative  . No narrative  on file    Family History  Problem Relation Age of Onset  . Heart disease Father   . Coronary artery disease Mother   . Coronary artery disease Brother     Past Medical History  Diagnosis Date  . CAD (coronary artery disease)     Severe native, Statuspost coronary bypass grafting 2004 with a I to the LAD and the sac was vein graft obtuse marginal and a  second vein graft to the diagonal. Preserved LV function with an ejection fraction of 55%  . Upper GI bleeding     history of upper GI bleeding status pst EGD and colonoscopy. Anicoagulation with aspirin and plavix  . Iron deficiency anemia     Secondary to GI bleed improved with current therapy  . Diabetes mellitus   . Hypertension   . Obesity   . DJD (degenerative joint disease)   . Internal mammary artery injury     Continued patency of the internal mammary to the LAD, occlusion of the saphenous vein graft to the diagonal, severe multiple high-grade lesions with large clot burder in saphenous vein graft to the OM with small  distal target, and high-grade in-stent restenosis  . Atrial fibrillation     History of postoperative atrial fibrillation  . Chronic renal insufficiency     rather than baseline 1.56  . GERD (gastroesophageal reflux disease)   . Peripheral vascular disease     s/p arteriogram by Dr. Burt Knack, bilaterally  . Hyperlipidemia   . Pneumonia     history 2003  . S/P AKA (above knee amputation) unilateral     2013  . Hx of CABG     CABG, 2004  . Ejection fraction     EF 40-45%, echo, August, 2012  . Carotid artery disease     Doppler, 2010, no significant disease  //  Doppler, February, 2 01/14/1938 to 62% R. ICA,  3-76% LICA, plan followup in one year   ( followup limited study  to assess subclavian  showed no  subclavian stenosis April, 2014    Past Surgical History  Procedure Laterality Date  . Coronary stents      three  . Lumbar fusions    . Laminectomy    . Anterior cervical discectomy    . Cardiac catheterization      2006  . Coronary artery bypass graft      stents x 3  . I&d extremity  04/07/2011    Procedure: IRRIGATION AND DEBRIDEMENT EXTREMITY;  Surgeon: Newt Minion, MD;  Location: [facility] [facility];  Service: Orthopedics;  Laterality: Left;  Left Partial Calcaneal Excision, Place Antibiotic Beads  . Amputation  08/12/2011    Procedure: AMPUTATION BELOW KNEE;  Surgeon: Newt Minion, MD;  Location: [facility];  Service: Orthopedics;  Laterality: Left;  Left Below Knee Amputation    Patient Active Problem List   Diagnosis Date Noted  . Sinus bradycardia 10/19/2012  . Carotid artery disease   . Upper GI bleeding   . Iron deficiency anemia   . Hypertension   . Atrial fibrillation   . Chronic renal insufficiency   . Peripheral vascular disease   . Hyperlipidemia   . S/P AKA (above knee amputation) unilateral   . Hx of CABG   . CAD (coronary artery disease)   . Ejection fraction   . Internal mammary artery injury   . Subclavian artery stenosis, left 03/26/2011  . SHORTNESS OF BREATH  05/12/2009  . AODM 12/05/2007  ROS   Patient denies fever, chills, headache, sweats, rash, change in vision, change in hearing, chest pain, cough, nausea or vomiting, urinary symptoms. All other systems are reviewed and are negative.  PHYSICAL EXAM  Patient is stable. He is overweight. He is here with his wife. Head is atraumatic. Sclera and conjunctiva are normal. There are no carotid bruits. Lungs are clear. Respiratory effort is nonlabored. Cardiac exam reveals S1 and S2. There no clicks or significant murmurs. The abdomen is soft. There is no peripheral edema in the right leg. His prosthesis is stable in the left leg.  Filed Vitals:   07/16/13 1537  BP: 151/65  Pulse: 58  Height: 6\' 1"  (1.854 m)  Weight: 205 lb (92.987 kg)   EKG is done today and reviewed by me. There is sinus rhythm with nonspecific ST changes.  ASSESSMENT & PLAN

## 2013-07-16 NOTE — Patient Instructions (Signed)
Your physician recommends that you schedule a follow-up appointment in: 6 months. You will receive a reminder letter in the mail in about 4 months reminding you to call and schedule your appointment. If you don't receive this letter, please contact our office. Your physician recommends that you continue on your current medications as directed. Please refer to the Current Medication list given to you today. Your physician has requested that you have a carotid duplex. This test is an ultrasound of the carotid arteries in your neck. It looks at blood flow through these arteries that supply the brain with blood. Allow one hour for this exam. There are no restrictions or special instructions.

## 2013-07-18 NOTE — Addendum Note (Signed)
Addended by: Merlene Laughter on: 07/18/2013 08:41 AM   Modules accepted: Orders

## 2013-08-10 ENCOUNTER — Other Ambulatory Visit: Payer: Self-pay | Admitting: Cardiology

## 2013-09-06 ENCOUNTER — Telehealth: Payer: Self-pay | Admitting: Cardiology

## 2013-09-06 NOTE — Telephone Encounter (Signed)
Benjamin [name] is going to have a tooth extraction on 09-19-13.  How many days should he be off the Plavix before procedure. Please call Audrea Muscat at home.

## 2013-09-07 NOTE — Telephone Encounter (Signed)
Per Dr. Ron Parker, its okay to hold plavix for 5 days and restart afterwards.

## 2013-09-07 NOTE — Telephone Encounter (Signed)
Bevely Palmer returned call.  She is at home

## 2013-09-07 NOTE — Telephone Encounter (Signed)
Per Dr. Ron Parker, patient is to hold the plavix only and continue the aspirin. Wife informed and verbalized understanding of plan.

## 2013-11-05 ENCOUNTER — Other Ambulatory Visit: Payer: Self-pay | Admitting: Cardiology

## 2013-11-09 ENCOUNTER — Other Ambulatory Visit: Payer: Self-pay | Admitting: Cardiology

## 2013-11-28 ENCOUNTER — Encounter (INDEPENDENT_AMBULATORY_CARE_PROVIDER_SITE_OTHER): Payer: PRIVATE HEALTH INSURANCE

## 2013-11-28 DIAGNOSIS — I739 Peripheral vascular disease, unspecified: Principal | ICD-10-CM

## 2013-11-28 DIAGNOSIS — I6523 Occlusion and stenosis of bilateral carotid arteries: Secondary | ICD-10-CM

## 2013-11-28 DIAGNOSIS — I779 Disorder of arteries and arterioles, unspecified: Secondary | ICD-10-CM

## 2013-11-30 ENCOUNTER — Telehealth: Payer: Self-pay | Admitting: *Deleted

## 2013-11-30 NOTE — Telephone Encounter (Signed)
-----   Message from Carlena Bjornstad, MD sent at 11/30/2013  9:26 AM EST ----- Please notify the patient that his carotid Doppler looks good. It may be slightly better than last year.

## 2013-12-03 NOTE — Telephone Encounter (Signed)
Patient's wife informed

## 2013-12-10 ENCOUNTER — Encounter: Payer: Self-pay | Admitting: Cardiology

## 2013-12-12 ENCOUNTER — Encounter: Payer: Self-pay | Admitting: Cardiology

## 2013-12-13 ENCOUNTER — Encounter: Payer: Self-pay | Admitting: Cardiology

## 2013-12-18 ENCOUNTER — Ambulatory Visit (INDEPENDENT_AMBULATORY_CARE_PROVIDER_SITE_OTHER): Payer: PRIVATE HEALTH INSURANCE | Admitting: *Deleted

## 2013-12-18 ENCOUNTER — Ambulatory Visit (INDEPENDENT_AMBULATORY_CARE_PROVIDER_SITE_OTHER): Payer: PRIVATE HEALTH INSURANCE | Admitting: Cardiology

## 2013-12-18 ENCOUNTER — Encounter: Payer: Self-pay | Admitting: Cardiology

## 2013-12-18 VITALS — BP 119/62 | HR 73 | Ht 72.0 in | Wt 266.0 lb

## 2013-12-18 DIAGNOSIS — I25810 Atherosclerosis of coronary artery bypass graft(s) without angina pectoris: Secondary | ICD-10-CM

## 2013-12-18 DIAGNOSIS — N184 Chronic kidney disease, stage 4 (severe): Secondary | ICD-10-CM

## 2013-12-18 DIAGNOSIS — N2889 Other specified disorders of kidney and ureter: Secondary | ICD-10-CM

## 2013-12-18 DIAGNOSIS — R0989 Other specified symptoms and signs involving the circulatory and respiratory systems: Secondary | ICD-10-CM

## 2013-12-18 DIAGNOSIS — D509 Iron deficiency anemia, unspecified: Secondary | ICD-10-CM

## 2013-12-18 DIAGNOSIS — R9430 Abnormal result of cardiovascular function study, unspecified: Secondary | ICD-10-CM

## 2013-12-18 DIAGNOSIS — IMO0002 Reserved for concepts with insufficient information to code with codable children: Secondary | ICD-10-CM

## 2013-12-18 DIAGNOSIS — I255 Ischemic cardiomyopathy: Secondary | ICD-10-CM

## 2013-12-18 DIAGNOSIS — Z5181 Encounter for therapeutic drug level monitoring: Secondary | ICD-10-CM

## 2013-12-18 DIAGNOSIS — I480 Paroxysmal atrial fibrillation: Secondary | ICD-10-CM

## 2013-12-18 DIAGNOSIS — Z7901 Long term (current) use of anticoagulants: Secondary | ICD-10-CM | POA: Insufficient documentation

## 2013-12-18 DIAGNOSIS — I739 Peripheral vascular disease, unspecified: Secondary | ICD-10-CM

## 2013-12-18 DIAGNOSIS — I779 Disorder of arteries and arterioles, unspecified: Secondary | ICD-10-CM

## 2013-12-18 LAB — POCT INR: INR: 1.4

## 2013-12-18 MED ORDER — AMLODIPINE BESYLATE 2.5 MG PO TABS: 2.5000 mg | ORAL_TABLET | Freq: Every day | ORAL | Status: DC

## 2013-12-18 MED ORDER — CARVEDILOL 6.25 MG PO TABS: 6.2500 mg | ORAL_TABLET | Freq: Two times a day (BID) | ORAL | Status: DC

## 2013-12-18 NOTE — Assessment & Plan Note (Signed)
The patient continues to have iron deficiency anemia. He is receiving IV iron. It is my understanding that there is further ongoing evaluation for the possibility that he may have myeloma.

## 2013-12-18 NOTE — Progress Notes (Signed)
Patient ID: Benjamin [name], male   DOB: 1933/05/09, 80 y.o.   MRN: [id number]    HPI Patient is seen today after recent hospitalization at [facility]. I had received a phone call from Dr. Rowe Pavy about the patient while he was at [facility]. He had pneumonia. However he also had significant volume overload. This was all treated. It was noted that he had atrial fibrillation with a controlled rate. It was also noted that his ejection fraction by echo had decreased from 45% down into the range of 30-35%. By telephone call Dr. Rowe Pavy and I agreed that I would see the patient very early post hospitalization. He has severe renal insufficiency. He needed anticoagulation for his atrial fib. With his renal insufficiency Coumadin was the only drug that could be used and it was started. Also we discussed adjustments in the medications for his left ventricular dysfunction. I made it clear that any thoughts about an ICD would have to be made at a later date.  Allergies  Allergen Reactions  . Penicillins     REACTION: rash  . Percocet [Oxycodone-Acetaminophen] Nausea And Vomiting    Current Outpatient Prescriptions  Medication Sig Dispense Refill  . acetaminophen (TYLENOL) 325 MG tablet Take 650 mg by mouth as needed.    Marland Kitchen aspirin 81 MG tablet Take 81 mg by mouth daily.      Marland Kitchen doxazosin (CARDURA) 4 MG tablet TAKE 1 TABLET BY MOUTH TWICE DAILY 180 tablet 3  . ferrous sulfate 325 (65 FE) MG tablet Take 1 tablet (325 mg total) by mouth 3 (three) times daily with meals. (Patient taking differently: Take 325 mg by mouth daily. )    . gabapentin (NEURONTIN) 300 MG capsule Take 300 mg by mouth 3 (three) times daily.    Marland Kitchen glipiZIDE (GLUCOTROL XL) 10 MG 24 hr tablet Take 10 mg by mouth daily.    . hydrALAZINE (APRESOLINE) 25 MG tablet Take 25 mg by mouth 3 (three) times daily.    . isosorbide mononitrate (IMDUR) 30 MG 24 hr tablet Take 1 tablet (30 mg total) by mouth daily with breakfast. 90 tablet 3  .  L-Methylfolate-B6-B12 (METANX) 3-35-2 MG TABS Take 1 tablet by mouth daily.      Marland Kitchen levofloxacin (LEVAQUIN) 500 MG tablet Take 500 mg by mouth every other day.    . nitroGLYCERIN (NITROSTAT) 0.4 MG SL tablet Place 0.4 mg under the tongue every 5 (five) minutes as needed. For chest pain    . pantoprazole (PROTONIX) 40 MG tablet Take 40 mg by mouth at bedtime.     . pravastatin (PRAVACHOL) 80 MG tablet Take 1 tablet (80 mg total) by mouth daily. 90 tablet 3  . tamsulosin (FLOMAX) 0.4 MG CAPS capsule Take 0.4 mg by mouth daily.    Marland Kitchen torsemide (DEMADEX) 20 MG tablet Take 40 mg by mouth daily.    Marland Kitchen warfarin (COUMADIN) 5 MG tablet Take 5 mg by mouth. Take as directed, PMD will manage.    Marland Kitchen amLODipine (NORVASC) 2.5 MG tablet Take 1 tablet (2.5 mg total) by mouth daily. 180 tablet 3  . carvedilol (COREG) 6.25 MG tablet Take 1 tablet (6.25 mg total) by mouth 2 (two) times daily. 180 tablet 3   No current facility-administered medications for this visit.    History   Social History  . Marital Status: Married    Spouse Name: N/A    Number of Children: N/A  . Years of Education: N/A   Occupational History  .  Not on file.   Social History Main Topics  . Smoking status: Never Smoker   . Smokeless tobacco: Never Used  . Alcohol Use: No  . Drug Use: No  . Sexual Activity: Not on file   Other Topics Concern  . Not on file   Social History Narrative    Family History  Problem Relation Age of Onset  . Heart disease Father   . Coronary artery disease Mother   . Coronary artery disease Brother     Past Medical History  Diagnosis Date  . CAD (coronary artery disease)     Severe native, Statuspost coronary bypass grafting 2004 with a I to the LAD and the sac was vein graft obtuse marginal and a  second vein graft to the diagonal. Preserved LV function with an ejection fraction of 55%  . Upper GI bleeding     history of upper GI bleeding status pst EGD and colonoscopy. Anicoagulation with  aspirin and plavix  . Iron deficiency anemia     Secondary to GI bleed improved with current therapy  . Diabetes mellitus   . Hypertension   . Obesity   . DJD (degenerative joint disease)   . Internal mammary artery injury     Continued patency of the internal mammary to the LAD, occlusion of the saphenous vein graft to the diagonal, severe multiple high-grade lesions with large clot burder in saphenous vein graft to the OM with small distal target, and high-grade in-stent restenosis  . Atrial fibrillation     History of postoperative atrial fibrillation  . Chronic renal insufficiency     rather than baseline 1.56  . GERD (gastroesophageal reflux disease)   . Peripheral vascular disease     s/p arteriogram by Dr. Burt Knack, bilaterally  . Hyperlipidemia   . Pneumonia     history 2003  . S/P AKA (above knee amputation) unilateral     2013  . Hx of CABG     CABG, 2004  . Ejection fraction     EF 40-45%, echo, August, 2012  . Carotid artery disease     Doppler, 2010, no significant disease  //  Doppler, February, 2 01/14/1938 to 77% R. ICA,  8-24% LICA, plan followup in one year   ( followup limited study  to assess subclavian  showed no  subclavian stenosis April, 2014    Past Surgical History  Procedure Laterality Date  . Coronary stents      three  . Lumbar fusions    . Laminectomy    . Anterior cervical discectomy    . Cardiac catheterization      2006  . Coronary artery bypass graft      stents x 3  . I&d extremity  04/07/2011    Procedure: IRRIGATION AND DEBRIDEMENT EXTREMITY;  Surgeon: Newt Minion, MD;  Location: [place];  Service: Orthopedics;  Laterality: Left;  Left Partial Calcaneal Excision, Place Antibiotic Beads  . Amputation  08/12/2011    Procedure: AMPUTATION BELOW KNEE;  Surgeon: Newt Minion, MD;  Location: [place];  Service: Orthopedics;  Laterality: Left;  Left Below Knee Amputation    Patient Active Problem List   Diagnosis Date Noted  . CKD (chronic kidney  disease) stage 4, GFR 15-29 ml/min 12/18/2013  . Cardiomyopathy, ischemic 12/18/2013  . Paroxysmal atrial fibrillation 12/18/2013  . Sinus bradycardia 10/19/2012  . Carotid artery disease   . Upper GI bleeding   . Iron deficiency anemia   . Hypertension   .  Atrial fibrillation   . Chronic renal insufficiency   . Peripheral vascular disease   . Hyperlipidemia   . S/P AKA (above knee amputation) unilateral   . Hx of CABG   . CAD (coronary artery disease)   . Ejection fraction   . Internal mammary artery injury   . Subclavian artery stenosis, left 03/26/2011  . SHORTNESS OF BREATH 05/12/2009  . AODM 12/05/2007    ROS  Patient denies fever, chills, headache, sweats, rash, change in vision, change in hearing, chest pain, cough, nausea or vomiting, urinary symptoms. All other systems are reviewed and are negative.  PHYSICAL EXAM He is stable today in a wheelchair. He is overweight. He is oriented to person time and place. Affect is normal. He is here with his wife, Benjamin [name]. Head is atraumatic. Sclera and conjunctiva are normal. There is no jugular venous distention. Lungs are clear. Respiratory effort is nonlabored. Cardiac exam reveals S1 and S2. The rhythm is irregularly irregular. The abdomen is protuberant but soft. There is no edema in the right extremity. He has a prosthesis on his left leg. There are no other musculoskeletal deformities. There are no skin rashes. Neurologic is grossly intact.  Filed Vitals:   12/18/13 0838  BP: 119/62  Pulse: 73  Height: 6' (1.829 m)  Weight: 266 lb (120.657 kg)  SpO2: 95%     ASSESSMENT & PLAN

## 2013-12-18 NOTE — Assessment & Plan Note (Signed)
He now has severe renal insufficiency. He is being followed by nephrology.

## 2013-12-18 NOTE — Assessment & Plan Note (Addendum)
The patient is post CABG in the past. The last catheterization that I can find is 2006. At that time his LIMA to the LAD was patent. He had a Taxus stent placed in the OM. I do not see any recent exercise test. We'll need to look further to see if there was a catheterization done more recently than 2006.

## 2013-12-18 NOTE — Patient Instructions (Signed)
Your physician recommends that you schedule a follow-up appointment in: 12/24/13. Your physician has recommended you make the following change in your medication:  Decrease amlodipine to 2.5 mg daily. Increase carvedilol to 6.25 mg twice daily. Continue all other medications the same. Your physician has requested that you regularly monitor and record your blood pressure readings at home. Please use the same machine at the same time of day to check your readings and record them to bring to your follow-up visit. Please follow up in the Coumadin Clinic to have your INR's managed with Edrick Oh. If your INR is therapeutic, please stop your aspirin.

## 2013-12-18 NOTE — Assessment & Plan Note (Signed)
The patient had some perioperative atrial fibrillation in the past. Otherwise, the first time atrial fibrillation is documented his during his hospitalization December, 2015. We do not know how long he has had atrial fibrillation. In the hospital rate was controlled. Decision was made to use Coumadin as the only anticoagulant approved for patients with severe renal disease. As of today I do not plan to proceed with cardioversion or antiarrhythmic meds yet. I will be seeing him back soon. After he is fully anticoagulated, I may consider a one time cardioversion. It is possibility that the illness with pneumonia and CHF pushed him in atrial fibrillation. His Coumadin will be followed in our Coumadin clinic which is his choice. As soon as he is therapeutic, his aspirin will be stopped.

## 2013-12-18 NOTE — Assessment & Plan Note (Addendum)
Ejection fraction is now in the 30-35% range. This was in the setting of cardiopulmonary decompensation in the hospital and atrial fibrillation. Renal function is significantly reduced. He cannot receive an ACE inhibitor. He is on low-dose carvedilol. He is on hydralazine and Imdur. His carvedilol dose will be increased. Amlodipine will be lowered from 5-2.5 mg daily. I will be seeing him back very soon. I will push his hydralazine and nitrates up at that time. We will have to follow this over time to see if he has improvement in LV function. If not, we will have to decide if it is appropriate to ask for EP evaluation for ICD consideration. This may not be appropriate with his multitude of other medical problems.  As part of today's evaluation I spent greater than 25 minutes with his total care. More than half of this time was spent with direct consultation with the patient is wife concerning all of the multitude of issues I have outlined above.

## 2013-12-18 NOTE — Assessment & Plan Note (Signed)
He is at very careful follow-up of his carotid disease. Most recent study showed mild disease. There is no definite subclavian stenosis

## 2013-12-24 ENCOUNTER — Ambulatory Visit: Payer: PRIVATE HEALTH INSURANCE | Admitting: Cardiology

## 2013-12-24 ENCOUNTER — Encounter: Payer: PRIVATE HEALTH INSURANCE | Admitting: Cardiology

## 2013-12-26 ENCOUNTER — Ambulatory Visit (INDEPENDENT_AMBULATORY_CARE_PROVIDER_SITE_OTHER): Payer: PRIVATE HEALTH INSURANCE | Admitting: *Deleted

## 2013-12-26 DIAGNOSIS — I4891 Unspecified atrial fibrillation: Secondary | ICD-10-CM

## 2013-12-26 DIAGNOSIS — Z5181 Encounter for therapeutic drug level monitoring: Secondary | ICD-10-CM

## 2013-12-26 LAB — PROTIME-INR: INR: 1.4 — AB (ref 0.9–1.1)

## 2013-12-27 ENCOUNTER — Encounter: Payer: Self-pay | Admitting: Cardiology

## 2013-12-27 ENCOUNTER — Telehealth: Payer: Self-pay | Admitting: *Deleted

## 2013-12-27 NOTE — Telephone Encounter (Signed)
Gave wife coumadin instructions.  She has not heard from home health nurse yet.

## 2013-12-27 NOTE — Telephone Encounter (Signed)
Please call Audrea Muscat (wife) in reference to INR.

## 2014-01-01 ENCOUNTER — Other Ambulatory Visit: Payer: Self-pay | Admitting: Cardiology

## 2014-01-01 ENCOUNTER — Ambulatory Visit (INDEPENDENT_AMBULATORY_CARE_PROVIDER_SITE_OTHER): Payer: PRIVATE HEALTH INSURANCE | Admitting: *Deleted

## 2014-01-01 DIAGNOSIS — I4891 Unspecified atrial fibrillation: Secondary | ICD-10-CM

## 2014-01-01 DIAGNOSIS — Z5181 Encounter for therapeutic drug level monitoring: Secondary | ICD-10-CM

## 2014-01-01 DIAGNOSIS — I480 Paroxysmal atrial fibrillation: Secondary | ICD-10-CM

## 2014-01-01 LAB — POCT INR: INR: 2.5

## 2014-01-10 ENCOUNTER — Ambulatory Visit (INDEPENDENT_AMBULATORY_CARE_PROVIDER_SITE_OTHER): Payer: PRIVATE HEALTH INSURANCE | Admitting: *Deleted

## 2014-01-10 DIAGNOSIS — I480 Paroxysmal atrial fibrillation: Secondary | ICD-10-CM

## 2014-01-10 DIAGNOSIS — I4891 Unspecified atrial fibrillation: Secondary | ICD-10-CM

## 2014-01-10 DIAGNOSIS — Z5181 Encounter for therapeutic drug level monitoring: Secondary | ICD-10-CM

## 2014-01-10 LAB — POCT INR: INR: 3.8

## 2014-01-16 ENCOUNTER — Ambulatory Visit (INDEPENDENT_AMBULATORY_CARE_PROVIDER_SITE_OTHER): Payer: PRIVATE HEALTH INSURANCE | Admitting: Cardiology

## 2014-01-16 ENCOUNTER — Encounter: Payer: Self-pay | Admitting: Cardiology

## 2014-01-16 ENCOUNTER — Ambulatory Visit (INDEPENDENT_AMBULATORY_CARE_PROVIDER_SITE_OTHER): Payer: PRIVATE HEALTH INSURANCE | Admitting: *Deleted

## 2014-01-16 VITALS — BP 106/68 | HR 71 | Ht 73.0 in | Wt 271.0 lb

## 2014-01-16 DIAGNOSIS — I480 Paroxysmal atrial fibrillation: Secondary | ICD-10-CM

## 2014-01-16 DIAGNOSIS — I481 Persistent atrial fibrillation: Secondary | ICD-10-CM

## 2014-01-16 DIAGNOSIS — I255 Ischemic cardiomyopathy: Secondary | ICD-10-CM

## 2014-01-16 DIAGNOSIS — I4891 Unspecified atrial fibrillation: Secondary | ICD-10-CM

## 2014-01-16 DIAGNOSIS — Z5181 Encounter for therapeutic drug level monitoring: Secondary | ICD-10-CM

## 2014-01-16 DIAGNOSIS — I25810 Atherosclerosis of coronary artery bypass graft(s) without angina pectoris: Secondary | ICD-10-CM

## 2014-01-16 DIAGNOSIS — I4819 Other persistent atrial fibrillation: Secondary | ICD-10-CM

## 2014-01-16 LAB — POCT INR: INR: 2.3

## 2014-01-16 MED ORDER — CARVEDILOL 12.5 MG PO TABS: 12.5000 mg | ORAL_TABLET | Freq: Two times a day (BID) | ORAL | Status: DC

## 2014-01-16 MED ORDER — ISOSORBIDE MONONITRATE ER 60 MG PO TB24: 60.0000 mg | ORAL_TABLET | Freq: Every day | ORAL | Status: DC

## 2014-01-16 NOTE — Patient Instructions (Signed)
Your physician recommends that you schedule a follow-up appointment in: 02/08/14 with Dr. Ron Parker. Your physician has recommended you make the following change in your medication:  STOP AMLODIPINE. STOP ASPIRIN. INCREASE ISOSORBIDE MONONITRATE TO 60 MG DAILY. You may take (2) of your 30 mg tablets daily until they are finished. INCREASE CARVEDILOL TO 12.5 MG TWICE DAILY. You may take (2) of your 6.25 mg tablets twice daily until they are finished. Continue all other medications the same.

## 2014-01-16 NOTE — Progress Notes (Signed)
Patient ID: Benjamin [name], male   DOB: Apr 03, 1933, 81 y.o.   MRN: [id number]    HPI The patient is seen follow-up coronary disease, CHF, atrial fibrillation, anticoagulation, left ventricular dysfunction, renal dysfunction. In my note of December 18, 2013, I outlined the events at [facility]. When I saw him in the office we began titrating medications for his left ventricular dysfunction. Also we are in the process of coumadinization. His most recent INR is high. Today's it is 2.3. We will need a few more weeks to be sure that we have reached a stable level before consideration of cardioversion. He's feeling well.  Allergies  Allergen Reactions  . Penicillins     REACTION: rash  . Percocet [Oxycodone-Acetaminophen] Nausea And Vomiting    Current Outpatient Prescriptions  Medication Sig Dispense Refill  . acetaminophen (TYLENOL) 325 MG tablet Take 650 mg by mouth as needed.    . doxazosin (CARDURA) 4 MG tablet TAKE 1 TABLET BY MOUTH TWICE DAILY 180 tablet 3  . ferrous sulfate 325 (65 FE) MG tablet Take 1 tablet (325 mg total) by mouth 3 (three) times daily with meals. (Patient taking differently: Take 325 mg by mouth daily. )    . gabapentin (NEURONTIN) 300 MG capsule Take 300 mg by mouth 3 (three) times daily.    Marland Kitchen glipiZIDE (GLUCOTROL XL) 10 MG 24 hr tablet Take 10 mg by mouth daily.    . hydrALAZINE (APRESOLINE) 25 MG tablet Take 25 mg by mouth 3 (three) times daily.    Marland Kitchen L-Methylfolate-B6-B12 (METANX) 3-35-2 MG TABS Take 1 tablet by mouth daily.      . nitroGLYCERIN (NITROSTAT) 0.4 MG SL tablet Place 0.4 mg under the tongue every 5 (five) minutes as needed. For chest pain    . pantoprazole (PROTONIX) 40 MG tablet Take 40 mg by mouth at bedtime.     . pravastatin (PRAVACHOL) 80 MG tablet Take 1 tablet (80 mg total) by mouth daily. 90 tablet 3  . tamsulosin (FLOMAX) 0.4 MG CAPS capsule Take 0.4 mg by mouth daily.    Marland Kitchen torsemide (DEMADEX) 20 MG tablet Take 40 mg by mouth daily.    Marland Kitchen  warfarin (COUMADIN) 5 MG tablet Take 5 mg by mouth. Take as directed, PMD will manage.    . carvedilol (COREG) 12.5 MG tablet Take 1 tablet (12.5 mg total) by mouth 2 (two) times daily. 180 tablet 3  . isosorbide mononitrate (IMDUR) 60 MG 24 hr tablet Take 1 tablet (60 mg total) by mouth daily. 90 tablet 3   No current facility-administered medications for this visit.    History   Social History  . Marital Status: Married    Spouse Name: N/A    Number of Children: N/A  . Years of Education: N/A   Occupational History  . Not on file.   Social History Main Topics  . Smoking status: Never Smoker   . Smokeless tobacco: Never Used  . Alcohol Use: No  . Drug Use: No  . Sexual Activity: Not on file   Other Topics Concern  . Not on file   Social History Narrative    Family History  Problem Relation Age of Onset  . Heart disease Father   . Coronary artery disease Mother   . Coronary artery disease Brother     Past Medical History  Diagnosis Date  . CAD (coronary artery disease)     Severe native, Statuspost coronary bypass grafting 2004 with a I to the LAD  and the sac was vein graft obtuse marginal and a  second vein graft to the diagonal. Preserved LV function with an ejection fraction of 55%  . Upper GI bleeding     history of upper GI bleeding status pst EGD and colonoscopy. Anicoagulation with aspirin and plavix  . Iron deficiency anemia     Secondary to GI bleed improved with current therapy  . Diabetes mellitus   . Hypertension   . Obesity   . DJD (degenerative joint disease)   . Internal mammary artery injury     Continued patency of the internal mammary to the LAD, occlusion of the saphenous vein graft to the diagonal, severe multiple high-grade lesions with large clot burder in saphenous vein graft to the OM with small distal target, and high-grade in-stent restenosis  . Atrial fibrillation     History of postoperative atrial fibrillation  . Chronic renal  insufficiency     rather than baseline 1.56  . GERD (gastroesophageal reflux disease)   . Peripheral vascular disease     s/p arteriogram by Dr. Burt Knack, bilaterally  . Hyperlipidemia   . Pneumonia     history 2003  . S/P AKA (above knee amputation) unilateral     2013  . Hx of CABG     CABG, 2004  . Ejection fraction     EF 40-45%, echo, August, 2012  . Carotid artery disease     Doppler, 2010, no significant disease  //  Doppler, February, 2 01/14/1938 to 97% R. ICA,  0-26% LICA, plan followup in one year   ( followup limited study  to assess subclavian  showed no  subclavian stenosis April, 2014    Past Surgical History  Procedure Laterality Date  . Coronary stents      three  . Lumbar fusions    . Laminectomy    . Anterior cervical discectomy    . Cardiac catheterization      2006  . Coronary artery bypass graft      stents x 3  . I&d extremity  04/07/2011    Procedure: IRRIGATION AND DEBRIDEMENT EXTREMITY;  Surgeon: Newt Minion, MD;  Location: [place];  Service: Orthopedics;  Laterality: Left;  Left Partial Calcaneal Excision, Place Antibiotic Beads  . Amputation  08/12/2011    Procedure: AMPUTATION BELOW KNEE;  Surgeon: Newt Minion, MD;  Location: [place];  Service: Orthopedics;  Laterality: Left;  Left Below Knee Amputation    Patient Active Problem List   Diagnosis Date Noted  . CKD (chronic kidney disease) stage 4, GFR 15-29 ml/min 12/18/2013  . Cardiomyopathy, ischemic 12/18/2013  . Encounter for therapeutic drug monitoring 12/18/2013  . Warfarin anticoagulation 12/18/2013  . Sinus bradycardia 10/19/2012  . Carotid artery disease   . Upper GI bleeding   . Iron deficiency anemia   . Hypertension   . Atrial fibrillation   . Chronic renal insufficiency   . Peripheral vascular disease   . Hyperlipidemia   . S/P AKA (above knee amputation) unilateral   . Hx of CABG   . CAD (coronary artery disease)   . Ejection fraction   . Internal mammary artery injury   .  Subclavian artery stenosis, left 03/26/2011  . SHORTNESS OF BREATH 05/12/2009  . AODM 12/05/2007    ROS  Patient denies fever, chills, headache, sweats, rash, change in vision, change in hearing, chest pain, cough, nausea or vomiting, urinary symptoms. All other systems are reviewed and are negative.  PHYSICAL EXAM  Patient is here with his wife. He is using a walker. He looks good. He is oriented to person time and place. Affect is normal. Head is atraumatic. Sclera and conjunctiva are normal. There is no jugular venous distention. Lungs are clear. Respiratory effort is nonlabored. Cardiac exam reveals S1 and S2. The rhythm is irregularly irregular. Abdomen is soft. There is no peripheral edema.  Filed Vitals:   01/16/14 1051  BP: 106/68  Pulse: 71  Height: 6\' 1"  (1.854 m)  Weight: 271 lb (122.925 kg)     ASSESSMENT & PLAN

## 2014-01-16 NOTE — Assessment & Plan Note (Addendum)
Coronary disease is stable at this time. Because he is now on Coumadin and he has not had unstable coronary problems recently, his aspirin will be stopped.

## 2014-01-16 NOTE — Assessment & Plan Note (Signed)
We will continue to titrate medications. With his renal dysfunction he cannot obtain an ACE inhibitor. He is on hydralazine and Imdur along with carvedilol. At the time of the last visit I had lowered his amlodipine. Today his amlodipine will be stopped. Imdur dose will be increased and carvedilol dose will be increased to 12.5 twice a day. I've chosen not to change his hydralazine dose today. His systolic pressures 004. I will then see him back for follow-up to continue his titration.

## 2014-01-16 NOTE — Assessment & Plan Note (Signed)
Atrial fibrillation continues. The rate is relatively well controlled. I will be increasing his carvedilol for his left ventricular dysfunction. His INR has finally come above 2.0. It was high most recently and now is 2.3. I will wait until his Coumadin is completely stabilized for considering cardioversion on one occasion.

## 2014-01-22 IMAGING — CR DG CHEST 2V
2 series · 2 of 2 positions shown · non-contrast
Comparison: July 01, 2010

CLINICAL DATA: Left BKA, coronary artery disease, diabetes,
hypertension

CHEST - 2 VIEW

[w chest pa]
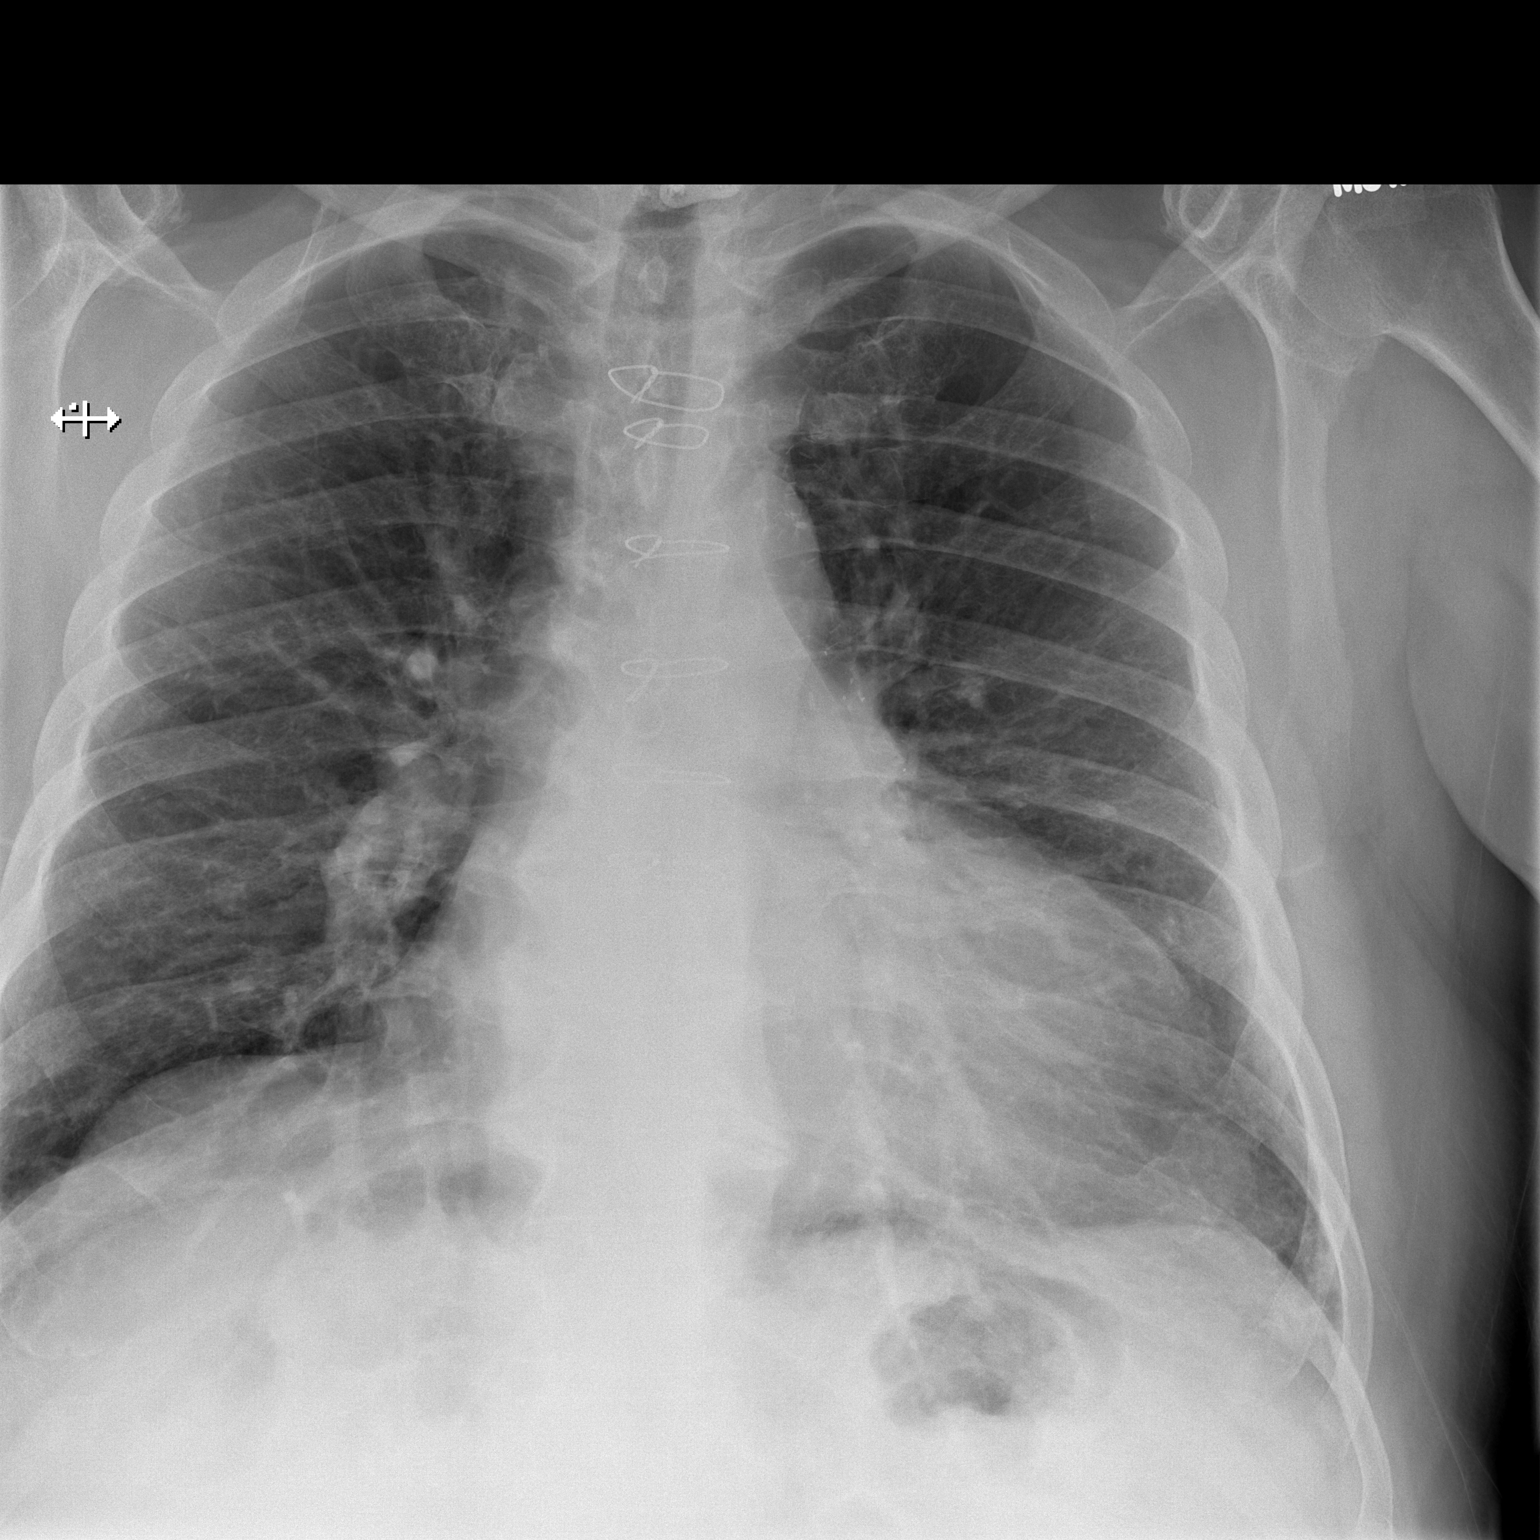

[w chest lat]
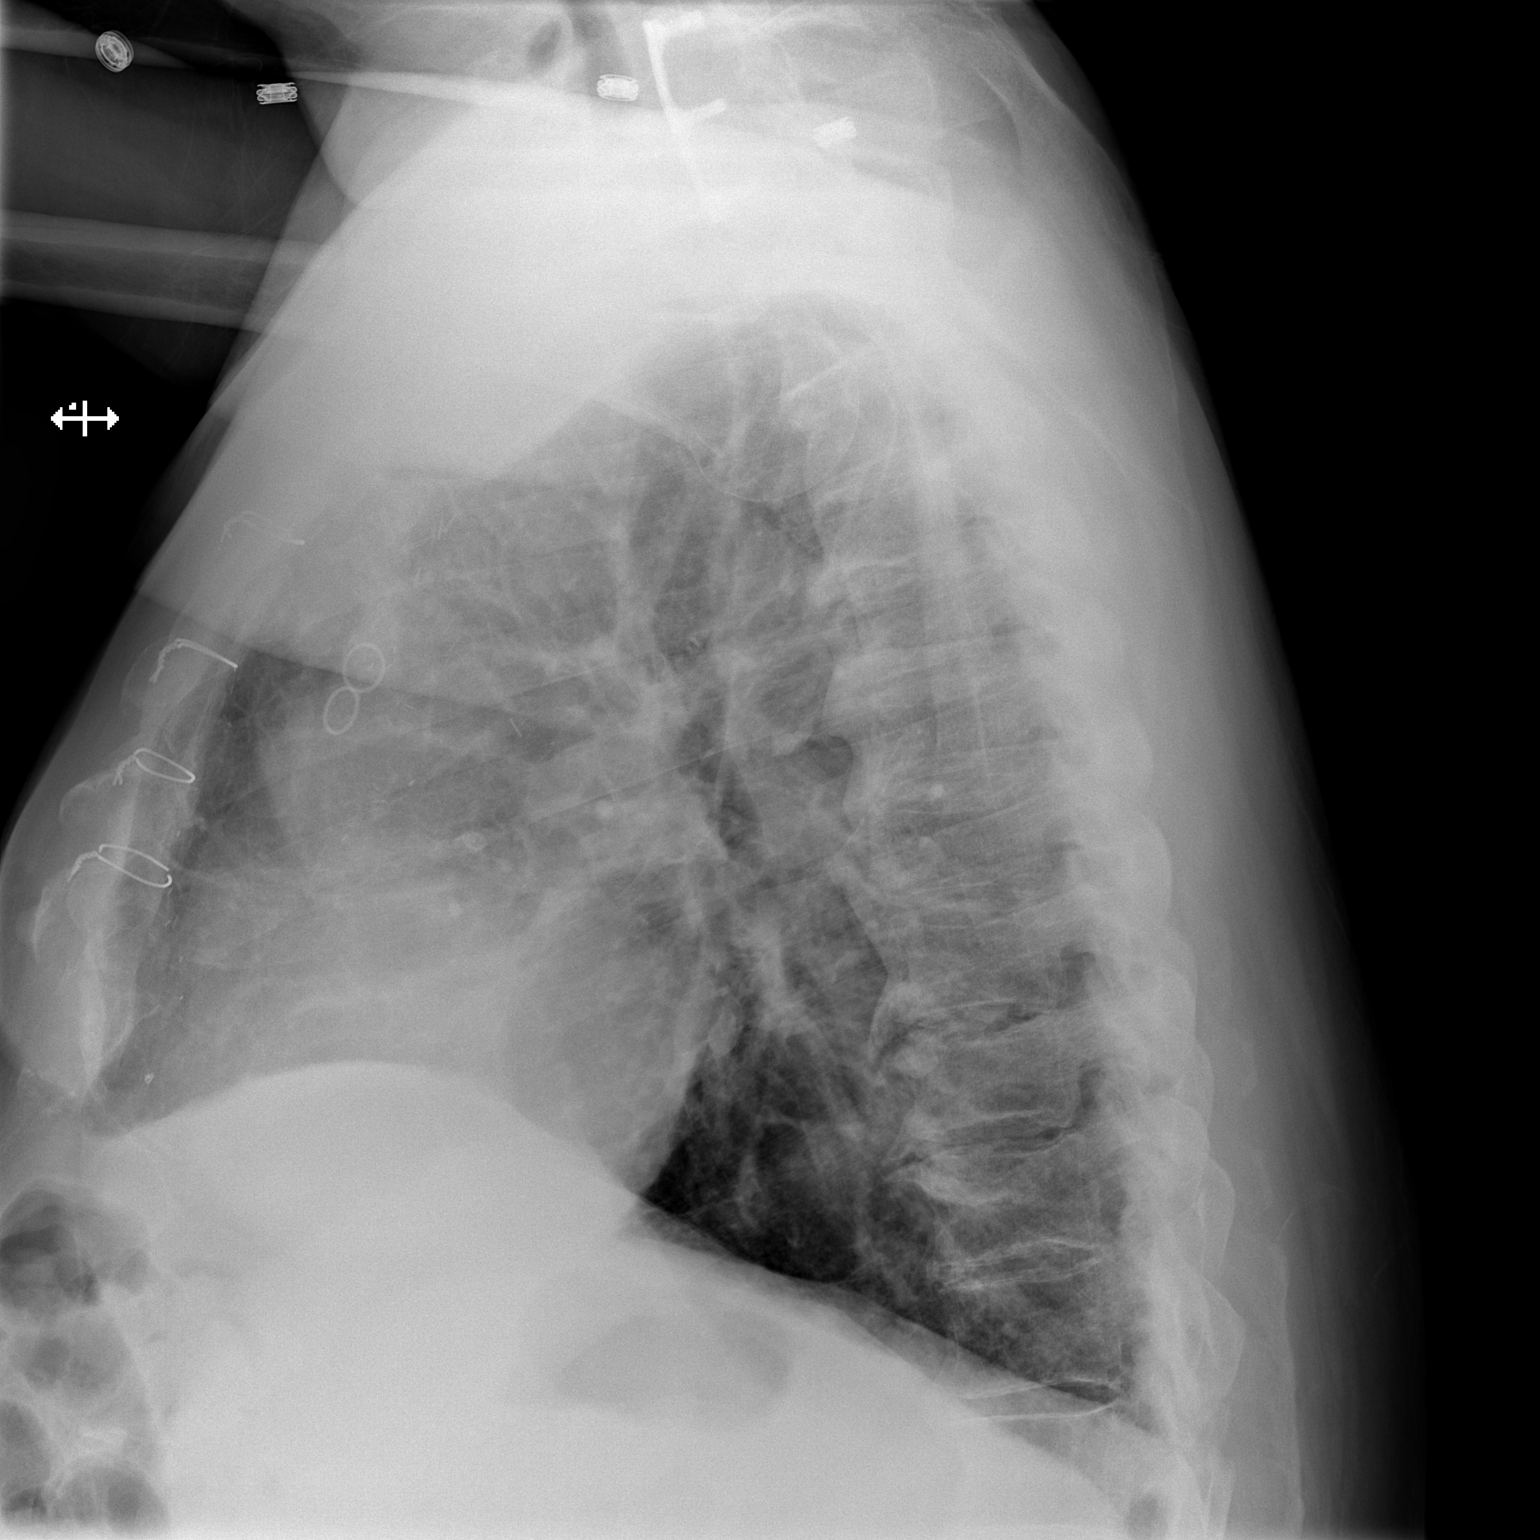

[2 of 2 positions shown; findings below may reference images not displayed]

FINDINGS: Cardiomegaly is unchanged.  There is pulmonary vascular
engorgement without frank edema.  The mediastinum is within normal
limits.  Both lungs are clear.  The right CP angle is cut off the
film.  The inferior most sternotomy wire is fractured, unchanged.
Hypertrophic degenerative changes are present within the thoracic
spine.
IMPRESSION: Cardiomegaly and pulmonary vascular engorgement without frank
edema, unchanged.

## 2014-01-28 ENCOUNTER — Telehealth: Payer: Self-pay | Admitting: Cardiology

## 2014-01-28 MED ORDER — WARFARIN SODIUM 5 MG PO TABS: 5.0000 mg | ORAL_TABLET | Freq: Every day | ORAL | Status: DC

## 2014-01-28 NOTE — Telephone Encounter (Signed)
Medication sent to pharmacy  

## 2014-01-28 NOTE — Telephone Encounter (Signed)
Please send refills to [facility] on [address]  warfarin (COUMADIN) 5 MG tablet

## 2014-01-29 ENCOUNTER — Ambulatory Visit (INDEPENDENT_AMBULATORY_CARE_PROVIDER_SITE_OTHER): Payer: PRIVATE HEALTH INSURANCE | Admitting: *Deleted

## 2014-01-29 DIAGNOSIS — I4891 Unspecified atrial fibrillation: Secondary | ICD-10-CM

## 2014-01-29 DIAGNOSIS — I480 Paroxysmal atrial fibrillation: Secondary | ICD-10-CM

## 2014-01-29 DIAGNOSIS — Z5181 Encounter for therapeutic drug level monitoring: Secondary | ICD-10-CM

## 2014-01-29 LAB — POCT INR: INR: 2.6

## 2014-02-04 ENCOUNTER — Telehealth: Payer: Self-pay | Admitting: Cardiology

## 2014-02-04 NOTE — Telephone Encounter (Signed)
Prescription sent already on 01/16/14 and should be on profile. Message will be faxed to [facility] so that prescription on file can be filled with new directions for imdur.

## 2014-02-04 NOTE — Telephone Encounter (Signed)
Mrs. Quintanar called in regards to  carvedilol (COREG) 12.5 MG tablet 180 tablet         States that she needs new RX called into [facility] due to increase in dosage.

## 2014-02-08 ENCOUNTER — Encounter: Payer: Self-pay | Admitting: Cardiology

## 2014-02-08 ENCOUNTER — Ambulatory Visit (INDEPENDENT_AMBULATORY_CARE_PROVIDER_SITE_OTHER): Payer: PRIVATE HEALTH INSURANCE | Admitting: Cardiology

## 2014-02-08 VITALS — BP 130/68 | HR 50 | Ht 73.0 in | Wt 265.0 lb

## 2014-02-08 DIAGNOSIS — Z7901 Long term (current) use of anticoagulants: Secondary | ICD-10-CM

## 2014-02-08 DIAGNOSIS — I255 Ischemic cardiomyopathy: Secondary | ICD-10-CM

## 2014-02-08 DIAGNOSIS — I4891 Unspecified atrial fibrillation: Secondary | ICD-10-CM

## 2014-02-08 MED ORDER — ISOSORBIDE MONONITRATE ER 60 MG PO TB24: 90.0000 mg | ORAL_TABLET | Freq: Every day | ORAL | Status: DC

## 2014-02-08 NOTE — Progress Notes (Signed)
HPI Patient is stable. He is seen today to follow-up CHF and atrial fibrillation and anticoagulation. I saw him last January 16, 2014. His carvedilol dose and Imdur or dose were increased and amlodipine was stopped. He is feeling well. His blood pressure at home is in the range of 419 systolic. His heart rate is in the range of 50. He is not having any chest pain or shortness of breath. There's been no syncope or presyncope. His anticoagulation is going well and his Coumadin is now on a stable dose.  Allergies  Allergen Reactions  . Penicillins     REACTION: rash  . Percocet [Oxycodone-Acetaminophen] Nausea And Vomiting    Current Outpatient Prescriptions  Medication Sig Dispense Refill  . acetaminophen (TYLENOL) 325 MG tablet Take 650 mg by mouth as needed.    . carvedilol (COREG) 12.5 MG tablet Take 1 tablet (12.5 mg total) by mouth 2 (two) times daily. 180 tablet 3  . doxazosin (CARDURA) 4 MG tablet TAKE 1 TABLET BY MOUTH TWICE DAILY 180 tablet 3  . ferrous sulfate 325 (65 FE) MG tablet Take 1 tablet (325 mg total) by mouth 3 (three) times daily with meals. (Patient taking differently: Take 325 mg by mouth daily. )    . gabapentin (NEURONTIN) 300 MG capsule Take 300 mg by mouth 3 (three) times daily.    Marland Kitchen glipiZIDE (GLUCOTROL XL) 10 MG 24 hr tablet Take 10 mg by mouth daily.    . hydrALAZINE (APRESOLINE) 25 MG tablet Take 25 mg by mouth 3 (three) times daily.    . isosorbide mononitrate (IMDUR) 60 MG 24 hr tablet Take 1 tablet (60 mg total) by mouth daily. 90 tablet 3  . L-Methylfolate-B6-B12 (METANX) 3-35-2 MG TABS Take 1 tablet by mouth daily.      . nitroGLYCERIN (NITROSTAT) 0.4 MG SL tablet Place 0.4 mg under the tongue every 5 (five) minutes as needed. For chest pain    . pantoprazole (PROTONIX) 40 MG tablet Take 40 mg by mouth at bedtime.     . pravastatin (PRAVACHOL) 80 MG tablet Take 1 tablet (80 mg total) by mouth daily. 90 tablet 3  . tamsulosin (FLOMAX) 0.4 MG CAPS capsule  Take 0.4 mg by mouth daily.    Marland Kitchen torsemide (DEMADEX) 20 MG tablet Take 40 mg by mouth daily.    Marland Kitchen warfarin (COUMADIN) 5 MG tablet Take 1 tablet (5 mg total) by mouth daily at 6 PM. Take as directed, PMD will manage. 45 tablet 1   No current facility-administered medications for this visit.    History   Social History  . Marital Status: Married    Spouse Name: N/A    Number of Children: N/A  . Years of Education: N/A   Occupational History  . Not on file.   Social History Main Topics  . Smoking status: Never Smoker   . Smokeless tobacco: Never Used  . Alcohol Use: No  . Drug Use: No  . Sexual Activity: Not on file   Other Topics Concern  . Not on file   Social History Narrative    Family History  Problem Relation Age of Onset  . Heart disease Father   . Coronary artery disease Mother   . Coronary artery disease Brother     Past Medical History  Diagnosis Date  . CAD (coronary artery disease)     Severe native, Statuspost coronary bypass grafting 2004 with a I to the LAD and the sac was vein graft  obtuse marginal and a  second vein graft to the diagonal. Preserved LV function with an ejection fraction of 55%  . Upper GI bleeding     history of upper GI bleeding status pst EGD and colonoscopy. Anicoagulation with aspirin and plavix  . Iron deficiency anemia     Secondary to GI bleed improved with current therapy  . Diabetes mellitus   . Hypertension   . Obesity   . DJD (degenerative joint disease)   . Internal mammary artery injury     Continued patency of the internal mammary to the LAD, occlusion of the saphenous vein graft to the diagonal, severe multiple high-grade lesions with large clot burder in saphenous vein graft to the OM with small distal target, and high-grade in-stent restenosis  . Atrial fibrillation     History of postoperative atrial fibrillation  . Chronic renal insufficiency     rather than baseline 1.56  . GERD (gastroesophageal reflux disease)    . Peripheral vascular disease     s/p arteriogram by Dr. Burt Knack, bilaterally  . Hyperlipidemia   . Pneumonia     history 2003  . S/P AKA (above knee amputation) unilateral     2013  . Hx of CABG     CABG, 2004  . Ejection fraction     EF 40-45%, echo, August, 2012  . Carotid artery disease     Doppler, 2010, no significant disease  //  Doppler, February, 2 01/14/1938 to 06% R. ICA,  2-37% LICA, plan followup in one year   ( followup limited study  to assess subclavian  showed no  subclavian stenosis April, 2014    Past Surgical History  Procedure Laterality Date  . Coronary stents      three  . Lumbar fusions    . Laminectomy    . Anterior cervical discectomy    . Cardiac catheterization      2006  . Coronary artery bypass graft      stents x 3  . I&d extremity  04/07/2011    Procedure: IRRIGATION AND DEBRIDEMENT EXTREMITY;  Surgeon: Newt Minion, MD;  Location: [place];  Service: Orthopedics;  Laterality: Left;  Left Partial Calcaneal Excision, Place Antibiotic Beads  . Amputation  08/12/2011    Procedure: AMPUTATION BELOW KNEE;  Surgeon: Newt Minion, MD;  Location: [place];  Service: Orthopedics;  Laterality: Left;  Left Below Knee Amputation    Patient Active Problem List   Diagnosis Date Noted  . CKD (chronic kidney disease) stage 4, GFR 15-29 ml/min 12/18/2013  . Cardiomyopathy, ischemic 12/18/2013  . Encounter for therapeutic drug monitoring 12/18/2013  . Warfarin anticoagulation 12/18/2013  . Sinus bradycardia 10/19/2012  . Carotid artery disease   . Upper GI bleeding   . Iron deficiency anemia   . Hypertension   . Atrial fibrillation   . Chronic renal insufficiency   . Peripheral vascular disease   . Hyperlipidemia   . S/P AKA (above knee amputation) unilateral   . Hx of CABG   . CAD (coronary artery disease)   . Ejection fraction   . Internal mammary artery injury   . Subclavian artery stenosis, left 03/26/2011  . SHORTNESS OF BREATH 05/12/2009  . AODM  12/05/2007    ROS  Patient denies fever, chills, headache, sweats, rash, change in vision, change in hearing, chest pain, cough, nausea or vomiting, urinary symptoms. All other systems are reviewed and are negative.  PHYSICAL EXAM Patient is here with his wife.  He is oriented to person time and place. Affect is normal. He is overweight. Head is atraumatic. Sclera and conjunctiva are normal. There is no jugular venous distention. Lungs are clear. Respiratory effort is nonlabored. Cardiac exam reveals an S1 and S2. The rhythm is regular. Abdomen is soft. There is no significant peripheral edema.  Filed Vitals:   02/08/14 0812  BP: 130/68  Pulse: 50  Height: 6\' 1"  (1.854 m)  Weight: 265 lb (120.203 kg)  SpO2: 98%   EKG is done today and reviewed by me. There is sinus rhythm was sinus bradycardia.  ASSESSMENT & PLAN

## 2014-02-08 NOTE — Assessment & Plan Note (Signed)
He is now stable on Coumadin. Aspirin for coronary disease has been stopped as his coronary status has been stable.

## 2014-02-08 NOTE — Assessment & Plan Note (Signed)
Atrial fib was noted with a hospitalization in December, 2015. He is now coumadinized. His rhythm is regular today. I had been considering cardioversion but this appears to not be necessary at this time.

## 2014-02-08 NOTE — Patient Instructions (Signed)
   Increase Imdur to 90mg  daily (1 1/2 tabs of the 60mg  tablet) - new sent to pharmacy today. Continue all current medications. Follow up in  4 weeks

## 2014-02-08 NOTE — Assessment & Plan Note (Signed)
His heart rate is slow on the current dose of carvedilol but he appears to be quite stable. His systolic pressure runs in the range of 130. I will increase his Imdur or slightly. Otherwise no change. After time there will be a follow-up 2-D echo to reassess LV function.

## 2014-02-21 ENCOUNTER — Ambulatory Visit (INDEPENDENT_AMBULATORY_CARE_PROVIDER_SITE_OTHER): Payer: PRIVATE HEALTH INSURANCE | Admitting: *Deleted

## 2014-02-21 DIAGNOSIS — Z5181 Encounter for therapeutic drug level monitoring: Secondary | ICD-10-CM | POA: Diagnosis not present

## 2014-02-21 DIAGNOSIS — I480 Paroxysmal atrial fibrillation: Secondary | ICD-10-CM | POA: Diagnosis not present

## 2014-02-21 LAB — POCT INR: INR: 3.5

## 2014-03-07 ENCOUNTER — Ambulatory Visit (INDEPENDENT_AMBULATORY_CARE_PROVIDER_SITE_OTHER): Payer: PRIVATE HEALTH INSURANCE | Admitting: *Deleted

## 2014-03-07 DIAGNOSIS — I480 Paroxysmal atrial fibrillation: Secondary | ICD-10-CM

## 2014-03-07 DIAGNOSIS — Z5181 Encounter for therapeutic drug level monitoring: Secondary | ICD-10-CM

## 2014-03-07 LAB — POCT INR: INR: 2.2

## 2014-03-12 ENCOUNTER — Telehealth: Payer: Self-pay | Admitting: *Deleted

## 2014-03-12 NOTE — Telephone Encounter (Signed)
Spoke with wife.  States dermatologist increased Doxycycline to 100mg  bid for infection on arm.  Told wife to continue current coumadin dose and come for INR check on 03/1014.   (Coumadin dose was already decreased when pt was started on doxycycline 100mg  qd)

## 2014-03-21 ENCOUNTER — Ambulatory Visit (INDEPENDENT_AMBULATORY_CARE_PROVIDER_SITE_OTHER): Payer: PRIVATE HEALTH INSURANCE | Admitting: *Deleted

## 2014-03-21 DIAGNOSIS — Z5181 Encounter for therapeutic drug level monitoring: Secondary | ICD-10-CM

## 2014-03-21 DIAGNOSIS — I480 Paroxysmal atrial fibrillation: Secondary | ICD-10-CM

## 2014-03-21 LAB — POCT INR: INR: 2.2

## 2014-04-04 ENCOUNTER — Ambulatory Visit (INDEPENDENT_AMBULATORY_CARE_PROVIDER_SITE_OTHER): Payer: PRIVATE HEALTH INSURANCE | Admitting: *Deleted

## 2014-04-04 DIAGNOSIS — I480 Paroxysmal atrial fibrillation: Secondary | ICD-10-CM

## 2014-04-04 DIAGNOSIS — Z5181 Encounter for therapeutic drug level monitoring: Secondary | ICD-10-CM | POA: Diagnosis not present

## 2014-04-04 LAB — POCT INR: INR: 2.2

## 2014-04-17 ENCOUNTER — Ambulatory Visit (INDEPENDENT_AMBULATORY_CARE_PROVIDER_SITE_OTHER): Payer: PRIVATE HEALTH INSURANCE | Admitting: Cardiology

## 2014-04-17 ENCOUNTER — Ambulatory Visit (INDEPENDENT_AMBULATORY_CARE_PROVIDER_SITE_OTHER): Payer: PRIVATE HEALTH INSURANCE | Admitting: *Deleted

## 2014-04-17 ENCOUNTER — Encounter: Payer: Self-pay | Admitting: Cardiology

## 2014-04-17 VITALS — BP 104/60 | HR 45 | Ht 73.0 in | Wt 264.0 lb

## 2014-04-17 DIAGNOSIS — I480 Paroxysmal atrial fibrillation: Secondary | ICD-10-CM

## 2014-04-17 DIAGNOSIS — Z7901 Long term (current) use of anticoagulants: Secondary | ICD-10-CM

## 2014-04-17 DIAGNOSIS — I255 Ischemic cardiomyopathy: Secondary | ICD-10-CM | POA: Diagnosis not present

## 2014-04-17 DIAGNOSIS — I25810 Atherosclerosis of coronary artery bypass graft(s) without angina pectoris: Secondary | ICD-10-CM

## 2014-04-17 DIAGNOSIS — Z5181 Encounter for therapeutic drug level monitoring: Secondary | ICD-10-CM

## 2014-04-17 DIAGNOSIS — N184 Chronic kidney disease, stage 4 (severe): Secondary | ICD-10-CM

## 2014-04-17 LAB — POCT INR: INR: 2.8

## 2014-04-17 NOTE — Assessment & Plan Note (Signed)
Coronary disease is stable. No further workup at this time. 

## 2014-04-17 NOTE — Progress Notes (Signed)
Cardiology Office Note   Date:  04/17/2014   ID:  Benjamin [name], DOB 12-Jul-1933, MRN [id number]  PCP:  Deloria Lair, MD  Cardiologist:  Dola Argyle, MD   Chief Complaint  Patient presents with  . Appointment    Follow-up CHF and atrial fibrillation and coronary artery disease      History of Present Illness: Benjamin [name] is a 81 y.o. male who presents today to follow-up coronary disease and left ventricular dysfunction and anticoagulation and atrial fibrillation. From the cardiac viewpoint he is actually feeling well. He continues to have renal dysfunction and may require dialysis soon. He also needs a prostate biopsy. In addition he needs some dental work done. We had a careful discussion today about stopping his Coumadin. In addition, the patient has continued to take Plavix at home even though it has not been listed as one of our medications since his Coumadin was stopped. Fortunately there is been no bleeding. He will stop his Plavix as of today. I've adjusted medicines for left ventricular dysfunction. Medications are limited due to his renal insufficiency. Systolic blood pressure is 100. We cannot adjust his medicines any further at this time. He is not having any chest pain or shortness of breath.    Past Medical History  Diagnosis Date  . CAD (coronary artery disease)     Severe native, Statuspost coronary bypass grafting 2004 with a I to the LAD and the sac was vein graft obtuse marginal and a  second vein graft to the diagonal. Preserved LV function with an ejection fraction of 55%  . Upper GI bleeding     history of upper GI bleeding status pst EGD and colonoscopy. Anicoagulation with aspirin and plavix  . Iron deficiency anemia     Secondary to GI bleed improved with current therapy  . Diabetes mellitus   . Hypertension   . Obesity   . DJD (degenerative joint disease)   . Internal mammary artery injury     Continued patency of the internal mammary to the LAD,  occlusion of the saphenous vein graft to the diagonal, severe multiple high-grade lesions with large clot burder in saphenous vein graft to the OM with small distal target, and high-grade in-stent restenosis  . Atrial fibrillation     History of postoperative atrial fibrillation  . Chronic renal insufficiency     rather than baseline 1.56  . GERD (gastroesophageal reflux disease)   . Peripheral vascular disease     s/p arteriogram by Dr. Burt Knack, bilaterally  . Hyperlipidemia   . Pneumonia     history 2003  . S/P AKA (above knee amputation) unilateral     2013  . Hx of CABG     CABG, 2004  . Ejection fraction     EF 40-45%, echo, August, 2012  . Carotid artery disease     Doppler, 2010, no significant disease  //  Doppler, February, 2 01/14/1938 to 58% R. ICA,  5-27% LICA, plan followup in one year   ( followup limited study  to assess subclavian  showed no  subclavian stenosis April, 2014    Past Surgical History  Procedure Laterality Date  . Coronary stents      three  . Lumbar fusions    . Laminectomy    . Anterior cervical discectomy    . Cardiac catheterization      2006  . Coronary artery bypass graft      stents x 3  . I&d  extremity  04/07/2011    Procedure: IRRIGATION AND DEBRIDEMENT EXTREMITY;  Surgeon: Newt Minion, MD;  Location: [place];  Service: Orthopedics;  Laterality: Left;  Left Partial Calcaneal Excision, Place Antibiotic Beads  . Amputation  08/12/2011    Procedure: AMPUTATION BELOW KNEE;  Surgeon: Newt Minion, MD;  Location: [place];  Service: Orthopedics;  Laterality: Left;  Left Below Knee Amputation    Patient Active Problem List   Diagnosis Date Noted  . CKD (chronic kidney disease) stage 4, GFR 15-29 ml/min 12/18/2013  . Cardiomyopathy, ischemic 12/18/2013  . Encounter for therapeutic drug monitoring 12/18/2013  . Warfarin anticoagulation 12/18/2013  . Sinus bradycardia 10/19/2012  . Carotid artery disease   . Upper GI bleeding   . Iron deficiency  anemia   . Hypertension   . Atrial fibrillation   . Chronic renal insufficiency   . Peripheral vascular disease   . Hyperlipidemia   . S/P AKA (above knee amputation) unilateral   . Hx of CABG   . CAD (coronary artery disease)   . Ejection fraction   . Internal mammary artery injury   . Subclavian artery stenosis, left 03/26/2011  . SHORTNESS OF BREATH 05/12/2009  . AODM 12/05/2007      Current Outpatient Prescriptions  Medication Sig Dispense Refill  . acetaminophen (TYLENOL) 325 MG tablet Take 650 mg by mouth as needed.    . carvedilol (COREG) 12.5 MG tablet Take 1 tablet (12.5 mg total) by mouth 2 (two) times daily. 180 tablet 3  . doxazosin (CARDURA) 4 MG tablet TAKE 1 TABLET BY MOUTH TWICE DAILY 180 tablet 3  . ferrous sulfate 325 (65 FE) MG tablet Take 1 tablet (325 mg total) by mouth 3 (three) times daily with meals. (Patient taking differently: Take 325 mg by mouth daily. )    . gabapentin (NEURONTIN) 300 MG capsule Take 300 mg by mouth 3 (three) times daily.    Marland Kitchen glipiZIDE (GLUCOTROL XL) 10 MG 24 hr tablet Take 10 mg by mouth daily.    . hydrALAZINE (APRESOLINE) 25 MG tablet Take 25 mg by mouth 3 (three) times daily.    . isosorbide mononitrate (IMDUR) 60 MG 24 hr tablet Take 1.5 tablets (90 mg total) by mouth daily. 45 tablet 6  . L-Methylfolate-B6-B12 (METANX) 3-35-2 MG TABS Take 1 tablet by mouth daily.      . nitroGLYCERIN (NITROSTAT) 0.4 MG SL tablet Place 0.4 mg under the tongue every 5 (five) minutes as needed. For chest pain    . pantoprazole (PROTONIX) 40 MG tablet Take 40 mg by mouth at bedtime.     . pravastatin (PRAVACHOL) 80 MG tablet Take 1 tablet (80 mg total) by mouth daily. 90 tablet 3  . tamsulosin (FLOMAX) 0.4 MG CAPS capsule Take 0.4 mg by mouth daily.    Marland Kitchen torsemide (DEMADEX) 20 MG tablet Take 40 mg by mouth daily.    Marland Kitchen warfarin (COUMADIN) 5 MG tablet Take 1 tablet (5 mg total) by mouth daily at 6 PM. Take as directed, PMD will manage. 45 tablet 1   No  current facility-administered medications for this visit.    Allergies:   Penicillins and Percocet    Social History:  The patient  reports that he has never smoked. He has never used smokeless tobacco. He reports that he does not drink alcohol or use illicit drugs.   Family History:  The patient's family history includes Coronary artery disease in his brother and mother; Heart disease in his  father.    ROS:  Please see the history of present illness.     Patient denies fever, chills, headache, sweats, rash, change in vision, change in hearing, chest pain, cough, nausea or vomiting, urinary symptoms. All other systems are reviewed and are negative.   PHYSICAL EXAM: VS:  BP 104/60 mmHg  Pulse 45  Ht 6\' 1"  (1.854 m)  Wt 264 lb (119.75 kg)  BMI 34.84 kg/m2  SpO2 96% , Patient is here with his wife as always. He is overweight but stable. He is oriented to person time and place. Affect is normal. Head is atraumatic. Sclera and conjunctiva are normal. There is no jugulovenous distention. Lungs are clear. Respiratory effort is not labored. Cardiac exam reveals an S1 and S2. The rhythm is regular. Abdomen is soft. He has no edema in his one leg. He has the prosthesis on his other leg. Neurologic is grossly intact. There are no skin rashes.  EKG:   EKG is not done today.   Recent Labs: No results found for requested labs within last 365 days.    Lipid Panel    Component Value Date/Time   CHOL  05/23/2009 0355    109        ATP III CLASSIFICATION:  <200     mg/dL   Desirable  200-239  mg/dL   Borderline High  >=240    mg/dL   High          TRIG 153* 05/23/2009 0355   HDL 23* 05/23/2009 0355   CHOLHDL 4.7 05/23/2009 0355   VLDL 31 05/23/2009 0355   LDLCALC  05/23/2009 0355    55        Total Cholesterol/HDL:CHD Risk Coronary Heart Disease Risk Table                     Men   Women  1/2 Average Risk   3.4   3.3  Average Risk       5.0   4.4  2 X Average Risk   9.6   7.1  3 X  Average Risk  23.4   11.0        Use the calculated Patient Ratio above and the CHD Risk Table to determine the patient's CHD Risk.        ATP III CLASSIFICATION (LDL):  <100     mg/dL   Optimal  100-129  mg/dL   Near or Above                    Optimal  130-159  mg/dL   Borderline  160-189  mg/dL   High  >190     mg/dL   Very High      Wt Readings from Last 3 Encounters:  04/17/14 264 lb (119.75 kg)  02/08/14 265 lb (120.203 kg)  01/16/14 271 lb (122.925 kg)      Current medicines are reviewed. We had an extensive discussion and review to try to understand why the patient is still taking Plavix at home. It appears that our understanding was that he had stopped aspirin and Plavix when Coumadin was started. Our records reflect this. However he has continued to take the Plavix at home.     ASSESSMENT AND PLAN:

## 2014-04-17 NOTE — Assessment & Plan Note (Signed)
At this time he is on the best medications that I can arrange for him. I considered whether we should do a follow-up echo at this time with a question of whether an ICD should be considered. He denied his wife discussed this. With all of his other medical problems, it is most prudent not to talk about ICD at this point. Therefore echo was not needed.

## 2014-04-17 NOTE — Assessment & Plan Note (Signed)
Coumadin is continued. He can hold it for 7 days before his dental procedure. He is to restart it the day of the procedure. We will be sure that our Coumadin team is aware.  As part of today's evaluation I spent greater than 25 minutes with his total care. More than half of this time has been with direct discussion with the patient and his wife about Coumadin, Plavix, ICD, possible dialysis.

## 2014-04-17 NOTE — Assessment & Plan Note (Signed)
His rhythm is regular. His rhythm had been regular at the time of the last visit. Coumadin will be continued.

## 2014-04-17 NOTE — Patient Instructions (Signed)
Your physician recommends that you schedule a follow-up appointment in: 3 months. You will receive a reminder letter in the mail in about 1-2 months reminding you to call and schedule your appointment. If you don't receive this letter, please contact our office. Your physician has recommended you make the following change in your medication:  PLEASE STOP TAKING PLAVIX AS INDICATED IN OUR PREVIOUS RECORDS. Continue all other medications the same. You may hold your warfarin 1 week prior to your dental procedure. Please restart warfarin the day of your procedure. Please follow up with Lattie Haw one week after restarting your warfarin.

## 2014-04-17 NOTE — Assessment & Plan Note (Signed)
It appears that he may need dialysis soon. He is stable at this time and is followed carefully by nephrology.

## 2014-04-18 ENCOUNTER — Telehealth: Payer: Self-pay | Admitting: Cardiology

## 2014-04-18 MED ORDER — WARFARIN SODIUM 5 MG PO TABS: 5.0000 mg | ORAL_TABLET | Freq: Every day | ORAL | Status: DC

## 2014-04-18 NOTE — Telephone Encounter (Signed)
Needs refill on Coumdin  ([facility])

## 2014-04-22 ENCOUNTER — Other Ambulatory Visit: Payer: Self-pay | Admitting: Cardiology

## 2014-05-09 ENCOUNTER — Ambulatory Visit (INDEPENDENT_AMBULATORY_CARE_PROVIDER_SITE_OTHER): Payer: PRIVATE HEALTH INSURANCE | Admitting: *Deleted

## 2014-05-09 DIAGNOSIS — I4891 Unspecified atrial fibrillation: Secondary | ICD-10-CM | POA: Diagnosis not present

## 2014-05-09 DIAGNOSIS — Z5181 Encounter for therapeutic drug level monitoring: Secondary | ICD-10-CM

## 2014-05-09 DIAGNOSIS — I480 Paroxysmal atrial fibrillation: Secondary | ICD-10-CM

## 2014-05-09 LAB — POCT INR: INR: 2.4

## 2014-05-28 ENCOUNTER — Other Ambulatory Visit: Payer: Self-pay | Admitting: Cardiology

## 2014-05-30 ENCOUNTER — Other Ambulatory Visit: Payer: Self-pay | Admitting: Cardiology

## 2014-06-05 ENCOUNTER — Telehealth: Payer: Self-pay | Admitting: *Deleted

## 2014-06-05 NOTE — Telephone Encounter (Signed)
Please advise if warfarin dose need to be adjusted.

## 2014-06-05 NOTE — Telephone Encounter (Signed)
Has prostate infection after prostate Bx last week.  Had shot of Rocephin today and started on levaquin 750mg  qd x 14 days.  Told wife to have pt take coumadin 2.5mg  tonight then 5mg  daily until INR check on 06/13/14.  She verbalized understanding.

## 2014-06-13 ENCOUNTER — Ambulatory Visit (INDEPENDENT_AMBULATORY_CARE_PROVIDER_SITE_OTHER): Payer: PRIVATE HEALTH INSURANCE | Admitting: *Deleted

## 2014-06-13 DIAGNOSIS — Z5181 Encounter for therapeutic drug level monitoring: Secondary | ICD-10-CM

## 2014-06-13 DIAGNOSIS — I4891 Unspecified atrial fibrillation: Secondary | ICD-10-CM | POA: Diagnosis not present

## 2014-06-13 DIAGNOSIS — I480 Paroxysmal atrial fibrillation: Secondary | ICD-10-CM

## 2014-06-13 LAB — POCT INR: INR: 1.4

## 2014-06-27 ENCOUNTER — Ambulatory Visit (INDEPENDENT_AMBULATORY_CARE_PROVIDER_SITE_OTHER): Payer: PRIVATE HEALTH INSURANCE | Admitting: *Deleted

## 2014-06-27 DIAGNOSIS — I480 Paroxysmal atrial fibrillation: Secondary | ICD-10-CM | POA: Diagnosis not present

## 2014-06-27 DIAGNOSIS — Z5181 Encounter for therapeutic drug level monitoring: Secondary | ICD-10-CM

## 2014-06-27 DIAGNOSIS — I4891 Unspecified atrial fibrillation: Secondary | ICD-10-CM

## 2014-06-27 LAB — POCT INR: INR: 2.5

## 2014-07-11 ENCOUNTER — Ambulatory Visit (INDEPENDENT_AMBULATORY_CARE_PROVIDER_SITE_OTHER): Payer: PRIVATE HEALTH INSURANCE | Admitting: *Deleted

## 2014-07-11 DIAGNOSIS — Z5181 Encounter for therapeutic drug level monitoring: Secondary | ICD-10-CM

## 2014-07-11 DIAGNOSIS — I4891 Unspecified atrial fibrillation: Secondary | ICD-10-CM

## 2014-07-11 DIAGNOSIS — I480 Paroxysmal atrial fibrillation: Secondary | ICD-10-CM | POA: Diagnosis not present

## 2014-07-11 LAB — POCT INR: INR: 2.3

## 2014-07-12 ENCOUNTER — Other Ambulatory Visit: Payer: Self-pay | Admitting: Cardiology

## 2014-07-12 MED ORDER — WARFARIN SODIUM 5 MG PO TABS: 5.0000 mg | ORAL_TABLET | Freq: Every day | ORAL | Status: DC

## 2014-07-12 NOTE — Telephone Encounter (Signed)
Needs refill on Warfarin 5 mg  -[place]

## 2014-07-12 NOTE — Telephone Encounter (Signed)
Medication sent to pharmacy  

## 2014-08-01 ENCOUNTER — Ambulatory Visit: Payer: PRIVATE HEALTH INSURANCE | Admitting: Cardiology

## 2014-08-01 ENCOUNTER — Telehealth: Payer: Self-pay | Admitting: Cardiology

## 2014-08-01 NOTE — Telephone Encounter (Signed)
Is starting Cipro 250mg  daily x 6 days today.  (Finishes 7/26)  Will decrease coumadin to 5mg  daily except 7.5mg  on Tuesday until INR check on 08/08/14.  Appt made and wife verbalized understanding.

## 2014-08-01 NOTE — Telephone Encounter (Signed)
Mr. Brightbill has an Urinary Tract Infection . He was put on Cipro 250mg  1 every 18 hours Please call patient's wife Audrea Muscat.

## 2014-08-08 ENCOUNTER — Ambulatory Visit (INDEPENDENT_AMBULATORY_CARE_PROVIDER_SITE_OTHER): Payer: PRIVATE HEALTH INSURANCE | Admitting: *Deleted

## 2014-08-08 DIAGNOSIS — I480 Paroxysmal atrial fibrillation: Secondary | ICD-10-CM

## 2014-08-08 DIAGNOSIS — I4891 Unspecified atrial fibrillation: Secondary | ICD-10-CM

## 2014-08-08 DIAGNOSIS — Z5181 Encounter for therapeutic drug level monitoring: Secondary | ICD-10-CM

## 2014-08-08 LAB — POCT INR: INR: 1.8

## 2014-08-15 ENCOUNTER — Ambulatory Visit (INDEPENDENT_AMBULATORY_CARE_PROVIDER_SITE_OTHER): Payer: PRIVATE HEALTH INSURANCE | Admitting: *Deleted

## 2014-08-15 DIAGNOSIS — Z5181 Encounter for therapeutic drug level monitoring: Secondary | ICD-10-CM

## 2014-08-15 DIAGNOSIS — I4891 Unspecified atrial fibrillation: Secondary | ICD-10-CM | POA: Diagnosis not present

## 2014-08-15 DIAGNOSIS — I480 Paroxysmal atrial fibrillation: Secondary | ICD-10-CM | POA: Diagnosis not present

## 2014-08-15 LAB — POCT INR: INR: 3

## 2014-09-05 ENCOUNTER — Ambulatory Visit (INDEPENDENT_AMBULATORY_CARE_PROVIDER_SITE_OTHER): Payer: PRIVATE HEALTH INSURANCE | Admitting: *Deleted

## 2014-09-05 DIAGNOSIS — I480 Paroxysmal atrial fibrillation: Secondary | ICD-10-CM | POA: Diagnosis not present

## 2014-09-05 DIAGNOSIS — I4891 Unspecified atrial fibrillation: Secondary | ICD-10-CM | POA: Diagnosis not present

## 2014-09-05 DIAGNOSIS — Z5181 Encounter for therapeutic drug level monitoring: Secondary | ICD-10-CM

## 2014-09-05 LAB — POCT INR: INR: 3.5

## 2014-09-06 ENCOUNTER — Other Ambulatory Visit: Payer: Self-pay | Admitting: Cardiology

## 2014-09-11 ENCOUNTER — Ambulatory Visit (INDEPENDENT_AMBULATORY_CARE_PROVIDER_SITE_OTHER): Payer: PRIVATE HEALTH INSURANCE | Admitting: Cardiology

## 2014-09-11 ENCOUNTER — Encounter: Payer: Self-pay | Admitting: Cardiology

## 2014-09-11 VITALS — BP 161/72 | HR 54 | Ht 73.0 in | Wt 250.8 lb

## 2014-09-11 DIAGNOSIS — I25810 Atherosclerosis of coronary artery bypass graft(s) without angina pectoris: Secondary | ICD-10-CM

## 2014-09-11 DIAGNOSIS — R9430 Abnormal result of cardiovascular function study, unspecified: Secondary | ICD-10-CM

## 2014-09-11 DIAGNOSIS — C61 Malignant neoplasm of prostate: Secondary | ICD-10-CM

## 2014-09-11 DIAGNOSIS — I255 Ischemic cardiomyopathy: Secondary | ICD-10-CM

## 2014-09-11 DIAGNOSIS — I480 Paroxysmal atrial fibrillation: Secondary | ICD-10-CM

## 2014-09-11 DIAGNOSIS — IMO0002 Reserved for concepts with insufficient information to code with codable children: Secondary | ICD-10-CM

## 2014-09-11 DIAGNOSIS — N184 Chronic kidney disease, stage 4 (severe): Secondary | ICD-10-CM

## 2014-09-11 DIAGNOSIS — R0989 Other specified symptoms and signs involving the circulatory and respiratory systems: Secondary | ICD-10-CM

## 2014-09-11 DIAGNOSIS — Z7901 Long term (current) use of anticoagulants: Secondary | ICD-10-CM

## 2014-09-11 NOTE — Assessment & Plan Note (Signed)
The patient has an ischemic cardiomyopathy. He is on the medications that can be tolerated considering his multitude of problems. No further workup.

## 2014-09-11 NOTE — Assessment & Plan Note (Signed)
The patient underwent CABG in 2004. Catheterization in 2006 revealed that his vein grafts were occluded. LIMA to the LAD was patent. He received a Taxus stent to the OM. He is not having any chest pain. No further workup is indicated.

## 2014-09-11 NOTE — Assessment & Plan Note (Signed)
The patient converted to sinus rhythm spontaneously. Coumadin was started and he is remaining on Coumadin. His rhythm is regular. No further workup.

## 2014-09-11 NOTE — Assessment & Plan Note (Signed)
The patient has severe renal disease. He will probably need to undergo peritoneal dialysis after his prostate radiation is complete.

## 2014-09-11 NOTE — Assessment & Plan Note (Signed)
He will continue on Coumadin as long as it does not become contraindicated related to any other problems.  The patient is comfortable and lives a very meaningful life with his very nice wife. He has multiple medical problems. We all agree that we need to deal with one problem at a time. Currently his prostate is being radiated. After that the approach to his renal function will have to be addressed. I feel it is not appropriate to push for other cardiac testing or change in his medications at this time.

## 2014-09-11 NOTE — Progress Notes (Signed)
Cardiology Office Note   Date:  09/11/2014   ID:  Benjamin [name], DOB [birth year], MRN [id number]  PCP:  Deloria Lair, MD  Cardiologist:  Dola Argyle, MD   Chief Complaint  Patient presents with  . Appointment    Follow-up coronary disease and left ventricular dysfunction      History of Present Illness: Benjamin [name] is a 81 y.o. male who presents today to follow-up coronary disease and left ventricular dysfunction and paroxysmal atrial fibrillation. Benjamin [name] is married to Benjamin [name] who we have known for many years from working at [employer]. He has significant cardiac disease post CABG in the past. He has significant left ventricular dysfunction. He has severe renal disease and will require peritoneal dialysis. This year he developed atrial fibrillation and converted spontaneously eventually. He is anticoagulated with Coumadin. Most recently he was diagnosed with prostate cancer that is aggressive. He is receiving outpatient radiation therapy. His spirits are good. He is not having chest pain or shortness of breath.    Past Medical History  Diagnosis Date  . CAD (coronary artery disease)     Severe native, Statuspost coronary bypass grafting 2004 with a I to the LAD and the sac was vein graft obtuse marginal and a  second vein graft to the diagonal. Preserved LV function with an ejection fraction of 55%  . Upper GI bleeding     history of upper GI bleeding status pst EGD and colonoscopy. Anicoagulation with aspirin and plavix  . Iron deficiency anemia     Secondary to GI bleed improved with current therapy  . Diabetes mellitus   . Hypertension   . Obesity   . DJD (degenerative joint disease)   . Internal mammary artery injury     Continued patency of the internal mammary to the LAD, occlusion of the saphenous vein graft to the diagonal, severe multiple high-grade lesions with large clot burder in saphenous vein graft to the OM with small distal target, and high-grade  in-stent restenosis  . Atrial fibrillation     History of postoperative atrial fibrillation  . Chronic renal insufficiency     rather than baseline 1.56  . GERD (gastroesophageal reflux disease)   . Peripheral vascular disease     s/p arteriogram by Dr. Burt Knack, bilaterally  . Hyperlipidemia   . Pneumonia     history 2003  . S/P AKA (above knee amputation) unilateral     2013  . Hx of CABG     CABG, 2004  . Ejection fraction     EF 40-45%, echo, August, 2012  . Carotid artery disease     Doppler, 2010, no significant disease  //  Doppler, February, 2 01/14/1938 to 45% R. ICA,  8-09% LICA, plan followup in one year   ( followup limited study  to assess subclavian  showed no  subclavian stenosis April, 2014    Past Surgical History  Procedure Laterality Date  . Coronary stents      three  . Lumbar fusions    . Laminectomy    . Anterior cervical discectomy    . Cardiac catheterization      2006  . Coronary artery bypass graft      stents x 3  . I&d extremity  04/07/2011    Procedure: IRRIGATION AND DEBRIDEMENT EXTREMITY;  Surgeon: Newt Minion, MD;  Location: [facility];  Service: Orthopedics;  Laterality: Left;  Left Partial Calcaneal Excision, Place Antibiotic Beads  . Amputation  08/12/2011    Procedure: AMPUTATION BELOW KNEE;  Surgeon: Newt Minion, MD;  Location: [facility];  Service: Orthopedics;  Laterality: Left;  Left Below Knee Amputation    Patient Active Problem List   Diagnosis Date Noted  . CKD (chronic kidney disease) stage 4, GFR 15-29 ml/min 12/18/2013  . Cardiomyopathy, ischemic 12/18/2013  . Encounter for therapeutic drug monitoring 12/18/2013  . Warfarin anticoagulation 12/18/2013  . Sinus bradycardia 10/19/2012  . Carotid artery disease   . Upper GI bleeding   . Iron deficiency anemia   . Hypertension   . Atrial fibrillation   . Peripheral vascular disease   . Hyperlipidemia   . S/P AKA (above knee amputation) unilateral   . Hx of CABG   . CAD (coronary  artery disease)   . Ejection fraction   . Internal mammary artery injury   . Subclavian artery stenosis, left 03/26/2011  . SHORTNESS OF BREATH 05/12/2009  . AODM 12/05/2007      Current Outpatient Prescriptions  Medication Sig Dispense Refill  . acetaminophen (TYLENOL) 325 MG tablet Take 650 mg by mouth as needed.    . bicalutamide (CASODEX) 50 MG tablet Take 50 mg by mouth daily.    . carvedilol (COREG) 12.5 MG tablet Take 1 tablet (12.5 mg total) by mouth 2 (two) times daily. 180 tablet 3  . doxazosin (CARDURA) 4 MG tablet TAKE 1 TABLET BY MOUTH TWICE DAILY 180 tablet 3  . ferrous sulfate 325 (65 FE) MG tablet Take 325 mg by mouth daily.    Marland Kitchen gabapentin (NEURONTIN) 300 MG capsule Take 300 mg by mouth 3 (three) times daily.    Marland Kitchen glipiZIDE (GLUCOTROL XL) 10 MG 24 hr tablet Take 10 mg by mouth daily.    . hydrALAZINE (APRESOLINE) 25 MG tablet Take 25 mg by mouth 3 (three) times daily.    . isosorbide mononitrate (IMDUR) 60 MG 24 hr tablet Take 1.5 tablets (90 mg total) by mouth daily. 45 tablet 6  . L-Methylfolate-B6-B12 (METANX) 3-35-2 MG TABS Take 1 tablet by mouth daily.      . nitroGLYCERIN (NITROSTAT) 0.4 MG SL tablet Place 0.4 mg under the tongue every 5 (five) minutes as needed. For chest pain    . pantoprazole (PROTONIX) 40 MG tablet Take 40 mg by mouth at bedtime.     . pravastatin (PRAVACHOL) 80 MG tablet TAKE 1 TABLET BY MOUTH DAILY 90 tablet 3  . tamsulosin (FLOMAX) 0.4 MG CAPS capsule Take 0.4 mg by mouth daily.    Marland Kitchen torsemide (DEMADEX) 20 MG tablet Take 40 mg by mouth daily.    Marland Kitchen warfarin (COUMADIN) 5 MG tablet Take 1 tablet (5 mg total) by mouth daily at 6 PM. Take as directed, PMD will manage. 45 tablet 1   No current facility-administered medications for this visit.    Allergies:   Penicillins and Percocet    Social History:  The patient  reports that he has never smoked. He has never used smokeless tobacco. He reports that he does not drink alcohol or use illicit  drugs.   Family History:  The patient's family history includes Coronary artery disease in his brother and mother; Heart disease in his father.    ROS:  Please see the history of present illness.     Patient denies fever, chills, headache, sweats, rash, change in vision, change in hearing, chest pain, cough, nausea or vomiting, urinary symptoms. All other systems are reviewed and are negative.   PHYSICAL EXAM: VS:  BP 161/72 mmHg  Pulse 54  Ht 6\' 1"  (1.854 m)  Wt 250 lb 12.8 oz (113.762 kg)  BMI 33.10 kg/m2  SpO2 97% , The patient is oriented to person time and place. Affect is normal. He is here with his wife. Head is atraumatic. Sclera and conjunctiva are normal. There is no jugular venous distention. Lungs are clear. Respiratory effort is not labored. Cardiac exam reveals S1 and S2. The rhythm is regular. The patient is overweight. He is wearing a prosthesis on his left leg. There is no edema in the right leg. He has no skin rashes.  EKG:   EKG is not done today.   Recent Labs: No results found for requested labs within last 365 days.    Lipid Panel    Component Value Date/Time   CHOL  05/23/2009 0355    109        ATP III CLASSIFICATION:  <200     mg/dL   Desirable  200-239  mg/dL   Borderline High  >=240    mg/dL   High          TRIG 153* 05/23/2009 0355   HDL 23* 05/23/2009 0355   CHOLHDL 4.7 05/23/2009 0355   VLDL 31 05/23/2009 0355   LDLCALC  05/23/2009 0355    55        Total Cholesterol/HDL:CHD Risk Coronary Heart Disease Risk Table                     Men   Women  1/2 Average Risk   3.4   3.3  Average Risk       5.0   4.4  2 X Average Risk   9.6   7.1  3 X Average Risk  23.4   11.0        Use the calculated Patient Ratio above and the CHD Risk Table to determine the patient's CHD Risk.        ATP III CLASSIFICATION (LDL):  <100     mg/dL   Optimal  100-129  mg/dL   Near or Above                    Optimal  130-159  mg/dL   Borderline  160-189  mg/dL    High  >190     mg/dL   Very High      Wt Readings from Last 3 Encounters:  09/11/14 250 lb 12.8 oz (113.762 kg)  04/17/14 264 lb (119.75 kg)  02/08/14 265 lb (120.203 kg)      Current medicines are reviewed  The patient understands his medications.     ASSESSMENT AND PLAN:

## 2014-09-11 NOTE — Patient Instructions (Addendum)
Your physician recommends that you continue on your current medications as directed. Please refer to the Current Medication list given to you today. Your physician recommends that you schedule a follow-up appointment in: 6 months with Dr. McDowell. You will receive a reminder letter in the mail in about 4 months reminding you to call and schedule your appointment. If you don't receive this letter, please contact our office. 

## 2014-09-11 NOTE — Assessment & Plan Note (Signed)
His EF was in the range of 35% in December, 2015. He had atrial fib at that time. He is stable. I see no reason to reassess his LV function at this time as it would not change the approach to his care.

## 2014-09-19 ENCOUNTER — Ambulatory Visit (INDEPENDENT_AMBULATORY_CARE_PROVIDER_SITE_OTHER): Payer: PRIVATE HEALTH INSURANCE | Admitting: *Deleted

## 2014-09-19 DIAGNOSIS — I4891 Unspecified atrial fibrillation: Secondary | ICD-10-CM

## 2014-09-19 DIAGNOSIS — Z5181 Encounter for therapeutic drug level monitoring: Secondary | ICD-10-CM | POA: Diagnosis not present

## 2014-09-19 DIAGNOSIS — I480 Paroxysmal atrial fibrillation: Secondary | ICD-10-CM | POA: Diagnosis not present

## 2014-09-19 LAB — POCT INR: INR: 2.6

## 2014-10-04 ENCOUNTER — Other Ambulatory Visit: Payer: Self-pay | Admitting: Cardiology

## 2014-10-07 ENCOUNTER — Encounter: Payer: Self-pay | Admitting: *Deleted

## 2014-10-10 ENCOUNTER — Ambulatory Visit (INDEPENDENT_AMBULATORY_CARE_PROVIDER_SITE_OTHER): Payer: PRIVATE HEALTH INSURANCE | Admitting: *Deleted

## 2014-10-10 DIAGNOSIS — Z5181 Encounter for therapeutic drug level monitoring: Secondary | ICD-10-CM | POA: Diagnosis not present

## 2014-10-10 DIAGNOSIS — I480 Paroxysmal atrial fibrillation: Secondary | ICD-10-CM | POA: Diagnosis not present

## 2014-10-10 LAB — POCT INR: INR: 2.8

## 2014-11-05 ENCOUNTER — Ambulatory Visit (INDEPENDENT_AMBULATORY_CARE_PROVIDER_SITE_OTHER): Payer: PRIVATE HEALTH INSURANCE | Admitting: *Deleted

## 2014-11-05 DIAGNOSIS — Z5181 Encounter for therapeutic drug level monitoring: Secondary | ICD-10-CM | POA: Diagnosis not present

## 2014-11-05 DIAGNOSIS — I480 Paroxysmal atrial fibrillation: Secondary | ICD-10-CM | POA: Diagnosis not present

## 2014-11-05 DIAGNOSIS — I4891 Unspecified atrial fibrillation: Secondary | ICD-10-CM | POA: Diagnosis not present

## 2014-11-05 LAB — POCT INR: INR: 2.5

## 2014-11-12 ENCOUNTER — Other Ambulatory Visit: Payer: Self-pay | Admitting: Cardiology

## 2014-11-12 MED ORDER — DOXAZOSIN MESYLATE 4 MG PO TABS: 4.0000 mg | ORAL_TABLET | Freq: Two times a day (BID) | ORAL | Status: DC

## 2014-11-12 NOTE — Telephone Encounter (Signed)
.  refillschmg Doxazosin 4 mg  [place], [place]

## 2014-12-03 ENCOUNTER — Ambulatory Visit (INDEPENDENT_AMBULATORY_CARE_PROVIDER_SITE_OTHER): Payer: PRIVATE HEALTH INSURANCE | Admitting: *Deleted

## 2014-12-03 DIAGNOSIS — Z5181 Encounter for therapeutic drug level monitoring: Secondary | ICD-10-CM

## 2014-12-03 DIAGNOSIS — I4891 Unspecified atrial fibrillation: Secondary | ICD-10-CM

## 2014-12-03 DIAGNOSIS — I480 Paroxysmal atrial fibrillation: Secondary | ICD-10-CM | POA: Diagnosis not present

## 2014-12-03 LAB — POCT INR: INR: 2.3

## 2014-12-10 ENCOUNTER — Other Ambulatory Visit: Payer: Self-pay | Admitting: Cardiology

## 2014-12-10 MED ORDER — CARVEDILOL 12.5 MG PO TABS
12.5000 mg | ORAL_TABLET | Freq: Two times a day (BID) | ORAL | Status: DC
Start: 2014-12-10 — End: 2014-12-26

## 2014-12-10 NOTE — Telephone Encounter (Signed)
°*  STAT* If patient is at the pharmacy, call can be transferred to refill team.   1. Which medications need to be refilled? (please list name of each medication and dose if known)   carvedilol (COREG) 12.5 MG tablet        2. Which pharmacy/location (including street and city if local pharmacy) is medication to be sent to?[facility]   3. Do they need a 30 day or 90 day supply?[place]

## 2014-12-10 NOTE — Telephone Encounter (Signed)
Medication sent to pharmacy  

## 2014-12-20 ENCOUNTER — Telehealth: Payer: Self-pay

## 2014-12-20 NOTE — Telephone Encounter (Signed)
Contacted patient today by telephone trying to schedule a follow up appointment. No answer Mailed a recall to patient.  Due one year vascular studies.

## 2014-12-25 ENCOUNTER — Telehealth: Payer: Self-pay | Admitting: Cardiology

## 2014-12-25 NOTE — Telephone Encounter (Signed)
I have not seen him yet in clinic, former patient of Dr. Ron Parker. I reviewed the records, he is a complex patient. Since he is on Coreg at 12.5 mg twice daily and his heart rate is in the 40s, I would suggest cutting it back to 6.25 mg twice daily for now, and keep an eye on heart rate. If his visit needs to be moved up, that is fine.

## 2014-12-25 NOTE — Telephone Encounter (Signed)
Patient continues to have a low heart rate. Running in the low 40's to mid 50's. Patient is sleeping a lot. She states that he is lethargic at times. Please call Audrea Muscat on cell # [phone]. Marland Kitchen

## 2014-12-25 NOTE — Telephone Encounter (Signed)
Pt wife says pt is not lethargic, has been sleeping a lot (wife thinks this is due to being bored) HR at recent pcp visit was 70 (pcp suggested pt call cardiologist for this) and HR while on phone was 47. Pt denies CP/SOB/dizziness. LOV with Dr. Ron Parker in August was 54. Pt wife says HR remains between 40-50 most of the time, will see Dr Domenic Polite 02/2015. Will forward

## 2014-12-26 MED ORDER — CARVEDILOL 6.25 MG PO TABS: 6.2500 mg | ORAL_TABLET | Freq: Two times a day (BID) | ORAL | Status: DC

## 2014-12-26 NOTE — Telephone Encounter (Signed)
Pt agreeable to decreasing Coreg 6.25 mg bid and will continue to monitor HR. Will call back if HR remains low for appt. Medication list updated.

## 2015-01-01 ENCOUNTER — Other Ambulatory Visit: Payer: Self-pay | Admitting: Cardiology

## 2015-01-01 DIAGNOSIS — I6523 Occlusion and stenosis of bilateral carotid arteries: Secondary | ICD-10-CM

## 2015-01-08 ENCOUNTER — Ambulatory Visit: Payer: PRIVATE HEALTH INSURANCE

## 2015-01-08 DIAGNOSIS — I6523 Occlusion and stenosis of bilateral carotid arteries: Secondary | ICD-10-CM

## 2015-01-09 ENCOUNTER — Telehealth: Payer: Self-pay | Admitting: *Deleted

## 2015-01-09 NOTE — Telephone Encounter (Signed)
-----   Message from Satira Sark, MD sent at 01/08/2015  7:56 PM EST ----- Reviewed report. Stable 1-39% bilateral ICA stenosis noted.

## 2015-01-09 NOTE — Telephone Encounter (Signed)
Pt aware, routed to pcp 

## 2015-01-14 ENCOUNTER — Ambulatory Visit (INDEPENDENT_AMBULATORY_CARE_PROVIDER_SITE_OTHER): Payer: Medicare Other | Admitting: *Deleted

## 2015-01-14 DIAGNOSIS — I4891 Unspecified atrial fibrillation: Secondary | ICD-10-CM | POA: Diagnosis not present

## 2015-01-14 DIAGNOSIS — Z5181 Encounter for therapeutic drug level monitoring: Secondary | ICD-10-CM | POA: Diagnosis not present

## 2015-01-14 LAB — POCT INR: INR: 2.7

## 2015-02-18 ENCOUNTER — Telehealth: Payer: Self-pay | Admitting: Cardiology

## 2015-02-18 ENCOUNTER — Other Ambulatory Visit: Payer: Self-pay | Admitting: *Deleted

## 2015-02-18 MED ORDER — DOXAZOSIN MESYLATE 4 MG PO TABS: 4.0000 mg | ORAL_TABLET | Freq: Two times a day (BID) | ORAL | Status: DC

## 2015-02-18 NOTE — Telephone Encounter (Signed)
Called pharmacy to get rx ID # so that prior auth for doxazosin 4 mg. Per pharmacy, [facility] # is [id number].

## 2015-02-18 NOTE — Telephone Encounter (Signed)
Prior authorization will be faxed after signature from MD tomorrow.

## 2015-02-18 NOTE — Telephone Encounter (Signed)
Benjamin [name] [facility] -called stating that she needs for someone to call her in regards to patient's Doxazosin 4 mg  States that [facility] needs pre-authorization for this medication. Patient is out of medication  Please call cell # [phone].

## 2015-02-20 NOTE — Telephone Encounter (Addendum)
Wife informed that doxazosin has been approved and [facility] in [place] has been informed of this approval.

## 2015-02-21 ENCOUNTER — Encounter: Payer: Self-pay | Admitting: Cardiology

## 2015-02-21 ENCOUNTER — Ambulatory Visit (INDEPENDENT_AMBULATORY_CARE_PROVIDER_SITE_OTHER): Payer: Medicare Other | Admitting: Cardiology

## 2015-02-21 VITALS — BP 130/60 | HR 65 | Ht 73.0 in | Wt 267.0 lb

## 2015-02-21 DIAGNOSIS — I480 Paroxysmal atrial fibrillation: Secondary | ICD-10-CM

## 2015-02-21 DIAGNOSIS — I10 Essential (primary) hypertension: Secondary | ICD-10-CM

## 2015-02-21 DIAGNOSIS — N184 Chronic kidney disease, stage 4 (severe): Secondary | ICD-10-CM

## 2015-02-21 DIAGNOSIS — I255 Ischemic cardiomyopathy: Secondary | ICD-10-CM

## 2015-02-21 DIAGNOSIS — I25810 Atherosclerosis of coronary artery bypass graft(s) without angina pectoris: Secondary | ICD-10-CM

## 2015-02-21 DIAGNOSIS — I779 Disorder of arteries and arterioles, unspecified: Secondary | ICD-10-CM

## 2015-02-21 DIAGNOSIS — I739 Peripheral vascular disease, unspecified: Secondary | ICD-10-CM

## 2015-02-21 MED ORDER — WARFARIN SODIUM 5 MG PO TABS: 5.0000 mg | ORAL_TABLET | Freq: Every day | ORAL | Status: DC

## 2015-02-21 MED ORDER — ISOSORBIDE MONONITRATE ER 60 MG PO TB24: 90.0000 mg | ORAL_TABLET | Freq: Every day | ORAL | Status: DC

## 2015-02-21 NOTE — Patient Instructions (Signed)
Continue all current medications. Your physician wants you to follow up in: 6 months.  You will receive a reminder letter in the mail one-two months in advance.  If you don't receive a letter, please call our office to schedule the follow up appointment   

## 2015-02-21 NOTE — Progress Notes (Signed)
Cardiology Office Note  Date: 02/21/2015   ID: Benjamin [name], DOB 1933/12/02, MRN [id number]  PCP: Deloria Lair, MD  Primary Cardiologist: Rozann Lesches, MD   Chief Complaint  Patient presents with  . Coronary Artery Disease  . Cardiomyopathy  . Atrial Fibrillation    History of Present Illness: Benjamin [name] is a medically complex 82 y.o. male former patient of Dr. Ron Parker, now establishing follow-up with me. His wife is well-known to me from working at [employer]. I reviewed extensive records and updated his chart.  He presents today for a follow-up visit. States that overall he feels well. He continues to follow with Dr. Lowanda Foster, and still has not required dialysis. He makes urine with diuretics and still has CKD stage 4. His weight has been relatively stable. He has a left leg prosthesis and is able to ambulate around his house. He also uses a walker. He denies any falls.  He continues on Coumadin with follow-up in the anticoagulation clinic. Reported bleeding problems. He is found to be in sinus bradycardia today by ECG which I personally reviewed  with additional findings including PACs and PVCs, nonspecific ST changes.  Echocardiogram from 2015 is summarized below. He has a known cardiomyopathy that is being managed conservatively. He has tolerated his current regimen which includes Coreg, hydralazine, Imdur, Demadex and Pravachol.  Blood pressure is adequately controlled today as well.   Past Medical History  Diagnosis Date  . CAD (coronary artery disease)     Multivessel status post CABG 2004, DES to OM 2006, subsequently documented graft disease  . History of GI bleed   . Iron deficiency anemia   . Type 2 diabetes mellitus ([place])   . Essential hypertension   . Obesity   . DJD (degenerative joint disease)   . Paroxysmal atrial fibrillation (H[facility])   . CKD (chronic kidney disease) stage 4, GFR 15-29 ml/min ([facility])     Follows with Dr. Lowanda Foster  . GERD (gastroesophageal  reflux disease)   . Peripheral vascular disease ([place])   . Hyperlipidemia   . History of pneumonia   . Ischemic cardiomyopathy   . Carotid artery disease [facility])     Past Surgical History  Procedure Laterality Date  . Laminectomy    . Anterior cervical discectomy    . Coronary artery bypass graft  2004    LIMA to LAD, SVG to diagonal, SVG to OM  . I&d extremity  04/07/2011    Procedure: IRRIGATION AND DEBRIDEMENT EXTREMITY;  Surgeon: Newt Minion, MD;  Location: [facility];  Service: Orthopedics;  Laterality: Left;  Left Partial Calcaneal Excision, Place Antibiotic Beads  . Amputation  08/12/2011    Procedure: AMPUTATION BELOW KNEE;  Surgeon: Newt Minion, MD;  Location: [facility];  Service: Orthopedics;  Laterality: Left;  Left Below Knee Amputation    Current Outpatient Prescriptions  Medication Sig Dispense Refill  . acetaminophen (TYLENOL) 325 MG tablet Take 650 mg by mouth as needed.    . bicalutamide (CASODEX) 50 MG tablet Take 50 mg by mouth daily.    . carvedilol (COREG) 6.25 MG tablet Take 1 tablet (6.25 mg total) by mouth 2 (two) times daily. 180 tablet 3  . doxazosin (CARDURA) 4 MG tablet Take 1 tablet (4 mg total) by mouth 2 (two) times daily. 60 tablet 3  . ferrous sulfate 325 (65 FE) MG tablet Take 325 mg by mouth daily.    Marland Kitchen gabapentin (NEURONTIN) 300 MG capsule Take 300 mg by  mouth 3 (three) times daily.    Marland Kitchen glipiZIDE (GLUCOTROL XL) 10 MG 24 hr tablet Take 10 mg by mouth daily.    . hydrALAZINE (APRESOLINE) 25 MG tablet Take 25 mg by mouth 3 (three) times daily.    . isosorbide mononitrate (IMDUR) 60 MG 24 hr tablet Take 1.5 tablets (90 mg total) by mouth daily. 45 tablet 6  . L-Methylfolate-B6-B12 (METANX) 3-35-2 MG TABS Take 1 tablet by mouth daily.      . nitroGLYCERIN (NITROSTAT) 0.4 MG SL tablet Place 0.4 mg under the tongue every 5 (five) minutes as needed. For chest pain    . pantoprazole (PROTONIX) 40 MG tablet Take 40 mg by mouth at bedtime.     . pravastatin  (PRAVACHOL) 80 MG tablet TAKE 1 TABLET BY MOUTH DAILY 90 tablet 3  . tamsulosin (FLOMAX) 0.4 MG CAPS capsule Take 0.4 mg by mouth daily.    Marland Kitchen torsemide (DEMADEX) 20 MG tablet Take 40 mg by mouth daily.    Marland Kitchen warfarin (COUMADIN) 5 MG tablet Take 1-1.5 tablets (5-7.5 mg total) by mouth daily. As directed by Coumadin clinic 45 tablet 2   No current facility-administered medications for this visit.   Allergies:  Penicillins and Percocet   Social History: The patient  reports that he has never smoked. He has never used smokeless tobacco. He reports that he does not drink alcohol or use illicit drugs.   ROS:  Please see the history of present illness. Otherwise, complete review of systems is positive for NYHA class II dyspnea with typical activity.  All other systems are reviewed and negative.   Physical Exam: VS:  BP 130/60 mmHg  Pulse 65  Ht 6\' 1"  (1.854 m)  Wt 267 lb (121.11 kg)  BMI 35.23 kg/m2  SpO2 98%, BMI Body mass index is 35.23 kg/(m^2).  Wt Readings from Last 3 Encounters:  02/21/15 267 lb (121.11 kg)  09/11/14 250 lb 12.8 oz (113.762 kg)  04/17/14 264 lb (119.75 kg)    General: Obese male, appears comfortable at rest. HEENT: Conjunctiva and lids normal, oropharynx clear. Neck: Supple, no elevated JVP or carotid bruits, no thyromegaly. Lungs: Clear to auscultation, nonlabored breathing at rest. Cardiac: Regular rate and rhythm with ectopy, no S3, soft systolic murmur, no pericardial rub. Abdomen:  Protuberant, nontender, bowel sounds present, no guarding or rebound. Extremities:  Status post left below the knee amputation, right distal pulses 1-2+. Skin: Warm and dry. Musculoskeletal: No kyphosis. Neuropsychiatric: Alert and oriented x3, affect grossly appropriate.  ECG:  I personally reviewed the recent tracing from 02/08/2014 which showed sinus bradycardia at 47 bpm with prolonged PR interval, nonspecific IVCD, and nonspecific T-wave changes.  Recent Labwork:  January  2017: INR 2.7  Other Studies Reviewed Today:  Echocardiogram 12/10/2013 [facility]): Moderate LVH with LVEF 30-35%, moderate to severe left atrial enlargement, mildly dilated RV with normal contraction, moderately sclerotic aortic valve without stenosis, MAC with mild mitral regurgitation, no significant tricuspid regurgitation.  Assessment and Plan:  1. Paroxysmal atrial fibrillation, currently maintaining sinus rhythm and asymptomatic in terms of palpitations. Plan is to continue Coumadin for stroke prophylaxis. He follows in the anticoagulation clinic.  2.  Ischemic cardiomyopathy with LVEF 30-35% based on prior evaluation. He has been managed conservatively over time, no plans for device implantation.  I reviewed his current medical regimen which he has tolerated, and he has been symptomatically stable with NYHA class II symptoms. He does have associated renal disease but reports that he makes adequate  urine on Demadex, and his weight has been stable.  3. CKD stage 4, followed by Dr. Lowanda Foster. He has not required hemodialysis. Recent creatinine around 3.5.  4. Multivessel CAD status post CABG in 2004 with DES to the native OM in 2006. He has documented disease of both SVGs an continues on medical therapy. No active angina symptoms.  5. Essential hypertension, blood pressure is adequately controlled today.  6. Carotid artery disease, 1-39% bilateral ICA stenoses by Dopplers in December 2016.  Current medicines were reviewed with the patient today.   Orders Placed This Encounter  Procedures  . EKG 12-Lead    Disposition: FU with me in 6 months.   Signed, Satira Sark, MD, [facility] 02/21/2015 12:01 PM    [facility] [place], [place], [place] [address] Phone: [phone]; Fax: [phone]

## 2015-02-25 ENCOUNTER — Ambulatory Visit (INDEPENDENT_AMBULATORY_CARE_PROVIDER_SITE_OTHER): Payer: Medicare Other | Admitting: *Deleted

## 2015-02-25 DIAGNOSIS — Z5181 Encounter for therapeutic drug level monitoring: Secondary | ICD-10-CM

## 2015-02-25 DIAGNOSIS — I4891 Unspecified atrial fibrillation: Secondary | ICD-10-CM

## 2015-02-25 LAB — POCT INR: INR: 2.5

## 2015-04-08 ENCOUNTER — Ambulatory Visit (INDEPENDENT_AMBULATORY_CARE_PROVIDER_SITE_OTHER): Payer: Medicare Other | Admitting: *Deleted

## 2015-04-08 DIAGNOSIS — Z5181 Encounter for therapeutic drug level monitoring: Secondary | ICD-10-CM | POA: Diagnosis not present

## 2015-04-08 DIAGNOSIS — I4891 Unspecified atrial fibrillation: Secondary | ICD-10-CM

## 2015-04-08 LAB — POCT INR: INR: 2.6

## 2015-05-27 ENCOUNTER — Ambulatory Visit (INDEPENDENT_AMBULATORY_CARE_PROVIDER_SITE_OTHER): Payer: Medicare Other | Admitting: *Deleted

## 2015-05-27 DIAGNOSIS — Z5181 Encounter for therapeutic drug level monitoring: Secondary | ICD-10-CM | POA: Diagnosis not present

## 2015-05-27 DIAGNOSIS — I4891 Unspecified atrial fibrillation: Secondary | ICD-10-CM

## 2015-05-27 LAB — POCT INR: INR: 2.3

## 2015-06-30 ENCOUNTER — Other Ambulatory Visit: Payer: Self-pay | Admitting: Cardiology

## 2015-06-30 MED ORDER — WARFARIN SODIUM 5 MG PO TABS: 5.0000 mg | ORAL_TABLET | Freq: Every day | ORAL | Status: DC

## 2015-06-30 NOTE — Telephone Encounter (Signed)
REFILL:  warfarin (COUMADIN) 5 MG tablet   [facility]

## 2015-08-14 ENCOUNTER — Ambulatory Visit (INDEPENDENT_AMBULATORY_CARE_PROVIDER_SITE_OTHER): Payer: Medicare Other | Admitting: *Deleted

## 2015-08-14 DIAGNOSIS — I4891 Unspecified atrial fibrillation: Secondary | ICD-10-CM | POA: Diagnosis not present

## 2015-08-14 DIAGNOSIS — Z5181 Encounter for therapeutic drug level monitoring: Secondary | ICD-10-CM

## 2015-08-14 LAB — POCT INR: INR: 2.2

## 2015-08-27 ENCOUNTER — Encounter: Payer: Self-pay | Admitting: *Deleted

## 2015-08-28 ENCOUNTER — Encounter: Payer: Self-pay | Admitting: Cardiology

## 2015-08-28 ENCOUNTER — Ambulatory Visit (INDEPENDENT_AMBULATORY_CARE_PROVIDER_SITE_OTHER): Payer: Medicare Other | Admitting: *Deleted

## 2015-08-28 ENCOUNTER — Ambulatory Visit (INDEPENDENT_AMBULATORY_CARE_PROVIDER_SITE_OTHER): Payer: Medicare Other | Admitting: Cardiology

## 2015-08-28 ENCOUNTER — Encounter: Payer: Self-pay | Admitting: *Deleted

## 2015-08-28 VITALS — BP 122/85 | HR 96 | Ht 73.0 in | Wt 274.0 lb

## 2015-08-28 DIAGNOSIS — I480 Paroxysmal atrial fibrillation: Secondary | ICD-10-CM | POA: Diagnosis not present

## 2015-08-28 DIAGNOSIS — I4891 Unspecified atrial fibrillation: Secondary | ICD-10-CM

## 2015-08-28 DIAGNOSIS — I25810 Atherosclerosis of coronary artery bypass graft(s) without angina pectoris: Secondary | ICD-10-CM

## 2015-08-28 DIAGNOSIS — N184 Chronic kidney disease, stage 4 (severe): Secondary | ICD-10-CM

## 2015-08-28 DIAGNOSIS — I255 Ischemic cardiomyopathy: Secondary | ICD-10-CM

## 2015-08-28 DIAGNOSIS — Z5181 Encounter for therapeutic drug level monitoring: Secondary | ICD-10-CM | POA: Diagnosis not present

## 2015-08-28 LAB — POCT INR: INR: 2.1

## 2015-08-28 NOTE — Progress Notes (Signed)
Cardiology Office Note  Date: 08/28/2015   ID: [name], DOB [birth year], MRN [id number]  PCP: Benjamin Lair, MD  Primary Cardiologist: Benjamin Lesches, MD   Chief Complaint  Patient presents with  . Coronary Artery Disease  . Atrial Fibrillation    History of Present Illness: Benjamin [name] is a medically complex 82 y.o. male that I saw back in February, a former patient of Dr. Ron [name]. He presents today with his wife for a follow-up visit. I do not have complete records, but through discussion with the patient and his wife, I found out that he was admitted to [facility] about 6-8 weeks ago with UTI and sepsis. He had severe deconditioning and required a six-week stay in a nursing facility thereafter for rehabilitation. He has been home for 2 weeks. Also had a flare of congestive heart failure during his stay at the nursing facility as he was off regular diuretic therapy. Fortunately, he seems to be recuperating reasonably well. Not quite back to baseline but does feel much better.  He has not had any significant palpitations or chest pain. He is on Coumadin, followed in the anticoagulation clinic. No reported bleeding problems. Most recent INR was therapeutic.  I reviewed his medications. He continues on Demadex at 40 mg daily for now, reports stable weight at home. Also continues on Coreg for heart rate control of atrial fibrillation.  Past Medical History:  Diagnosis Date  . CAD (coronary artery disease)    Multivessel status post CABG 2004, DES to OM 2006, subsequently documented graft disease  . Carotid artery disease ([place])   . CKD (chronic kidney disease) stage 4, GFR 15-29 ml/min (HCC)    Follows with Dr. Lowanda [name]  . DJD (degenerative joint disease)   . Essential hypertension   . GERD (gastroesophageal reflux disease)   . History of GI bleed   . History of pneumonia   . Hyperlipidemia   . Iron deficiency anemia   . Ischemic cardiomyopathy   . Obesity   . Paroxysmal  atrial fibrillation (HCC)   . Peripheral vascular disease ([place])   . Type 2 diabetes mellitus ([place])     Past Surgical History:  Procedure Laterality Date  . AMPUTATION  08/12/2011   Procedure: AMPUTATION BELOW KNEE;  Surgeon: Benjamin Minion, MD;  Location: [place];  Service: Orthopedics;  Laterality: Left;  Left Below Knee Amputation  . ANTERIOR CERVICAL DISCECTOMY    . CORONARY ARTERY BYPASS GRAFT  2004   LIMA to LAD, SVG to diagonal, SVG to OM  . I&D EXTREMITY  04/07/2011   Procedure: IRRIGATION AND DEBRIDEMENT EXTREMITY;  Surgeon: Benjamin Minion, MD;  Location: [place];  Service: Orthopedics;  Laterality: Left;  Left Partial Calcaneal Excision, Place Antibiotic Beads  . LAMINECTOMY      Current Outpatient Prescriptions  Medication Sig Dispense Refill  . acetaminophen (TYLENOL) 325 MG tablet Take 650 mg by mouth as needed.    . carvedilol (COREG) 6.25 MG tablet Take 1 tablet (6.25 mg total) by mouth 2 (two) times daily. 180 tablet 3  . doxazosin (CARDURA) 4 MG tablet Take 1 tablet (4 mg total) by mouth 2 (two) times daily. 60 tablet 3  . ferrous sulfate 325 (65 FE) MG tablet Take 325 mg by mouth daily.    Marland Kitchen gabapentin (NEURONTIN) 300 MG capsule Take 300 mg by mouth 3 (three) times daily.    Marland Kitchen glipiZIDE (GLUCOTROL XL) 10 MG 24 hr tablet Take 10 mg by mouth  daily.    . hydrALAZINE (APRESOLINE) 25 MG tablet Take 25 mg by mouth 3 (three) times daily.    . isosorbide mononitrate (IMDUR) 60 MG 24 hr tablet Take 1.5 tablets (90 mg total) by mouth daily. 45 tablet 6  . L-Methylfolate-B6-B12 (METANX) 3-35-2 MG TABS Take 1 tablet by mouth daily.      . nitroGLYCERIN (NITROSTAT) 0.4 MG SL tablet Place 0.4 mg under the tongue every 5 (five) minutes as needed. For chest pain    . pantoprazole (PROTONIX) 40 MG tablet Take 40 mg by mouth at bedtime.     . pravastatin (PRAVACHOL) 80 MG tablet TAKE 1 TABLET BY MOUTH DAILY 90 tablet 3  . tamsulosin (FLOMAX) 0.4 MG CAPS capsule Take 0.4 mg by mouth daily.    Marland Kitchen  torsemide (DEMADEX) 20 MG tablet Take 40 mg by mouth daily.    Marland Kitchen warfarin (COUMADIN) 5 MG tablet Take 1-1.5 tablets (5-7.5 mg total) by mouth daily. As directed by Coumadin clinic 45 tablet 2   No current facility-administered medications for this visit.    Allergies:  Penicillins and Percocet [oxycodone-acetaminophen]   Social History: The patient  reports that he has never smoked. He has never used smokeless tobacco. He reports that he does not drink alcohol or use drugs.   ROS:  Please see the history of present illness. Otherwise, complete review of systems is positive for fatigue, NYHA class 2-3 dyspnea.  All other systems are reviewed and negative.   Physical Exam: VS:  BP 122/85   Pulse 96   Ht 6\' 1"  (1.854 m)   Wt 274 lb (124.3 kg)   SpO2 97%   BMI 36.15 kg/m , BMI Body mass index is 36.15 kg/m.  Wt Readings from Last 3 Encounters:  08/28/15 274 lb (124.3 kg)  02/21/15 267 lb (121.1 kg)  09/11/14 250 lb 12.8 oz (113.8 kg)    General: Obese male, appears comfortable at rest. HEENT: Conjunctiva and lids normal, oropharynx clear. Neck: Supple, no elevated JVP or carotid bruits, no thyromegaly. Lungs: Clear to auscultation, nonlabored breathing at rest. Cardiac: Irregularly irregular, no S3, soft systolic murmur, no pericardial rub. Abdomen:  Protuberant, nontender, bowel sounds present, no guarding or rebound. Extremities:  Status post left below the knee amputation, right distal pulses 1-2+. Skin: Warm and dry. Musculoskeletal: No kyphosis. Neuropsychiatric: Alert and oriented x3, affect grossly appropriate.  ECG: I personally reviewed the tracing from 02/21/2015 which showed sinus bradycardia with PACs and nonspecific ST-T changes.  Other Studies Reviewed Today:  Echocardiogram 12/10/2013 [facility]): Moderate LVH with LVEF 30-35%, moderate to severe left atrial enlargement, mildly dilated RV with normal contraction, moderately sclerotic aortic valve without stenosis,  MAC with mild mitral regurgitation, no significant tricuspid regurgitation.  Assessment and Plan:  1. Paroxysmal atrial fibrillation. Heart rate is irregular today, likely back in atrial fibrillation but asymptomatic in terms of palpitations and with adequate heart rate control. For now we will continue current regimen.  2. Ischemic cardiomyopathy with LVEF 30-35%. Managed conservatively over time, no plan for ICD. Volume status improved now back on Demadex which he had been off of for a period of tme in the nursing facility during rehabilitation. He is weighing himself daily.  3. CKD stage IV, followed by nephrology.  4. Multivessel CAD status post CABG in 2004 with DES to the native OM in 2006. He has documented disease of both SVGs an continues on medical therapy. No active angina symptoms.  Current medicines were reviewed with the  patient today.  Disposition: Follow-up with me in 6 months.  Signed, Satira Sark, MD, [facility] 08/28/2015 2:59 PM    [facility] [place], [place], [place] [address] Phone: [phone]; Fax: [phone]

## 2015-08-28 NOTE — Patient Instructions (Signed)

## 2015-09-11 ENCOUNTER — Telehealth: Payer: Self-pay | Admitting: *Deleted

## 2015-09-11 NOTE — Telephone Encounter (Signed)
Pt has been in hospital for UTI.  Was d/c home today on Cipro x 4-5 more days.  PCP told him to decrease coumadin to 2.5mg  daily till finished with Cipro.  Told wife this would be OK since pt has an INR appt on 9/7

## 2015-09-18 ENCOUNTER — Ambulatory Visit (INDEPENDENT_AMBULATORY_CARE_PROVIDER_SITE_OTHER): Payer: Medicare Other | Admitting: *Deleted

## 2015-09-18 DIAGNOSIS — I4891 Unspecified atrial fibrillation: Secondary | ICD-10-CM | POA: Diagnosis not present

## 2015-09-18 DIAGNOSIS — Z5181 Encounter for therapeutic drug level monitoring: Secondary | ICD-10-CM

## 2015-09-18 LAB — POCT INR: INR: 1.3

## 2015-10-02 ENCOUNTER — Ambulatory Visit (INDEPENDENT_AMBULATORY_CARE_PROVIDER_SITE_OTHER): Payer: Medicare Other | Admitting: *Deleted

## 2015-10-02 DIAGNOSIS — I4891 Unspecified atrial fibrillation: Secondary | ICD-10-CM | POA: Diagnosis not present

## 2015-10-02 DIAGNOSIS — Z5181 Encounter for therapeutic drug level monitoring: Secondary | ICD-10-CM

## 2015-10-02 LAB — POCT INR: INR: 1.7

## 2015-10-14 ENCOUNTER — Ambulatory Visit (INDEPENDENT_AMBULATORY_CARE_PROVIDER_SITE_OTHER): Payer: Medicare Other | Admitting: *Deleted

## 2015-10-14 DIAGNOSIS — Z5181 Encounter for therapeutic drug level monitoring: Secondary | ICD-10-CM

## 2015-10-14 DIAGNOSIS — I4891 Unspecified atrial fibrillation: Secondary | ICD-10-CM

## 2015-10-14 LAB — POCT INR: INR: 2.7

## 2015-10-22 ENCOUNTER — Encounter (HOSPITAL_COMMUNITY): Payer: Medicare Other | Attending: Hematology | Admitting: Hematology

## 2015-10-22 ENCOUNTER — Encounter (HOSPITAL_COMMUNITY): Payer: Self-pay | Admitting: Hematology

## 2015-10-22 VITALS — BP 130/68 | HR 72 | Temp 98.7°F | Resp 20 | Ht 73.0 in | Wt 268.0 lb

## 2015-10-22 DIAGNOSIS — Z8546 Personal history of malignant neoplasm of prostate: Secondary | ICD-10-CM

## 2015-10-22 DIAGNOSIS — N184 Chronic kidney disease, stage 4 (severe): Secondary | ICD-10-CM | POA: Diagnosis present

## 2015-10-22 DIAGNOSIS — D631 Anemia in chronic kidney disease: Secondary | ICD-10-CM | POA: Diagnosis not present

## 2015-10-22 DIAGNOSIS — C61 Malignant neoplasm of prostate: Secondary | ICD-10-CM

## 2015-10-22 NOTE — Patient Instructions (Signed)
[place] at [facility]  Discharge Instructions:  No    _______________________________________________________________  Thank you for choosing [place] at [facility] to provide your oncology and hematology care.  To afford each patient quality time with our providers, please arrive at least 15 minutes before your scheduled appointment.  You need to re-schedule your appointment if you arrive 10 or more minutes late.  We strive to give you quality time with our providers, and arriving late affects you and other patients whose appointments are after yours.  Also, if you no show three or more times for appointments you may be dismissed from the clinic.  Again, thank you for choosing [place] at [facility] hope is that these requests will allow you access to exceptional care and in a timely manner. _______________________________________________________________  If you have questions after your visit, please contact our office at [phone] between the hours of 8:30 a.m. and 5:00 p.m. Voicemails left after 4:30 p.m. will not be returned until the following business day. _______________________________________________________________  For prescription refill requests, have your pharmacy contact our office. _______________________________________________________________  Recommendations made by the consultant and any test results will be sent to your referring physician. _______________________________________________________________

## 2015-10-22 NOTE — Progress Notes (Signed)
Marland Kitchen    HEMATOLOGY/ONCOLOGY CONSULTATION NOTE  Date of Service: 10/22/2015  Patient Care Team: Zella Richer. Scotty Court, MD as PCP - General (Unknown Physician Specialty) Newt Minion, MD as Attending Physician (Orthopedic Surgery) Nephrologist: Dr Lowanda Foster MD   CHIEF COMPLAINTS/PURPOSE OF CONSULTATION:   Management of anemia of chronic disease due to his chronic kidney disease.  HISTORY OF PRESENTING ILLNESS:   Benjamin [name] is a wonderful 82 y.o. male who has been referred to [place] by Dr .Lowanda Foster for evaluation and management of anemia of chronic disease to determine if it would be safe to use Epogen in the setting of h/o prostate cancer.  Patient has a history of multivessel coronary artery disease status post CABG in 2004, chronic kidney disease stage IV, hypertension, dyslipidemia, peripheral vascular disease, diabetes, obesity. He has a history of localized prostate cancer diagnosed in 2015 status post external beam radiation therapy and follows with Dr. Exie Parody from urology and is on Lupron every 3 months starting from November 2016. Patient notes that his last dose of Lupron was in September 2017 and his PSA level was noted to be within normal limits. He has been recommended 2 years of Lupron treatment by his urologist. He had sepsis from urinary tract infection in June 2017 and has been placed on Macrobid by his urologist on recurrent urinary tract infections since then. He has had issues with anemia of chronic disease for which he has been on Epogen for the last several months. His hgb has been in the 9.8-10.4 range recently. No previous history of blood clots. Notes that his hypertension has been well controlled. Has been on oral iron previously for iron deficiency.  Patient notes he has been doing well and does not have any other acute concerns at this time. He had no other issues with his hypertension or tolerating epogen.  MEDICAL HISTORY:  Past Medical History:  Diagnosis Date  .  CAD (coronary artery disease)    Multivessel status post CABG 2004, DES to OM 2006, subsequently documented graft disease  . Carotid artery disease ([place])   . CKD (chronic kidney disease) stage 4, GFR 15-29 ml/min (HCC)    Follows with Dr. Lowanda Foster  . DJD (degenerative joint disease)   . Essential hypertension   . GERD (gastroesophageal reflux disease)   . History of GI bleed   . History of pneumonia   . Hyperlipidemia   . Iron deficiency anemia   . Ischemic cardiomyopathy   . Obesity   . Paroxysmal atrial fibrillation (HCC)   . Peripheral vascular disease ([place])   . Type 2 diabetes mellitus ([place])     SURGICAL HISTORY: Past Surgical History:  Procedure Laterality Date  . AMPUTATION  08/12/2011   Procedure: AMPUTATION BELOW KNEE;  Surgeon: Newt Minion, MD;  Location: [place];  Service: Orthopedics;  Laterality: Left;  Left Below Knee Amputation  . ANTERIOR CERVICAL DISCECTOMY    . CORONARY ARTERY BYPASS GRAFT  2004   LIMA to LAD, SVG to diagonal, SVG to OM  . I&D EXTREMITY  04/07/2011   Procedure: IRRIGATION AND DEBRIDEMENT EXTREMITY;  Surgeon: Newt Minion, MD;  Location: [place];  Service: Orthopedics;  Laterality: Left;  Left Partial Calcaneal Excision, Place Antibiotic Beads  . LAMINECTOMY      SOCIAL HISTORY: Social History   Social History  . Marital status: Married    Spouse name: N/A  . Number of children: N/A  . Years of education: N/A   Occupational History  .  Not on file.   Social History Main Topics  . Smoking status: Never Smoker  . Smokeless tobacco: Never Used  . Alcohol use No  . Drug use: No  . Sexual activity: Not Currently   Other Topics Concern  . Not on file   Social History Narrative  . No narrative on file    FAMILY HISTORY: Family History  Problem Relation Age of Onset  . Heart disease Father   . Coronary artery disease Mother   . Coronary artery disease Brother     ALLERGIES:  is allergic to penicillins and percocet  [oxycodone-acetaminophen].  MEDICATIONS:  Current Outpatient Prescriptions  Medication Sig Dispense Refill  . acetaminophen (TYLENOL) 325 MG tablet Take 650 mg by mouth as needed.    . carvedilol (COREG) 6.25 MG tablet Take 1 tablet (6.25 mg total) by mouth 2 (two) times daily. 180 tablet 3  . doxazosin (CARDURA) 4 MG tablet Take 1 tablet (4 mg total) by mouth 2 (two) times daily. 60 tablet 3  . ferrous sulfate 325 (65 FE) MG tablet Take 325 mg by mouth daily.    Marland Kitchen gabapentin (NEURONTIN) 300 MG capsule Take 300 mg by mouth 3 (three) times daily.    Marland Kitchen glipiZIDE (GLUCOTROL XL) 10 MG 24 hr tablet Take 10 mg by mouth daily.    . hydrALAZINE (APRESOLINE) 25 MG tablet Take 25 mg by mouth 3 (three) times daily.    . isosorbide mononitrate (IMDUR) 60 MG 24 hr tablet Take 1.5 tablets (90 mg total) by mouth daily. 45 tablet 6  . L-Methylfolate-B6-B12 (METANX) 3-35-2 MG TABS Take 1 tablet by mouth daily.      . nitrofurantoin, macrocrystal-monohydrate, (MACROBID) 100 MG capsule     . nitroGLYCERIN (NITROSTAT) 0.4 MG SL tablet Place 0.4 mg under the tongue every 5 (five) minutes as needed. For chest pain    . pantoprazole (PROTONIX) 40 MG tablet Take 40 mg by mouth at bedtime.     . pravastatin (PRAVACHOL) 80 MG tablet TAKE 1 TABLET BY MOUTH DAILY 90 tablet 3  . tamsulosin (FLOMAX) 0.4 MG CAPS capsule Take 0.4 mg by mouth daily.    Marland Kitchen torsemide (DEMADEX) 20 MG tablet Take 40 mg by mouth daily.    Marland Kitchen warfarin (COUMADIN) 5 MG tablet Take 1-1.5 tablets (5-7.5 mg total) by mouth daily. As directed by Coumadin clinic 45 tablet 2   No current facility-administered medications for this visit.     REVIEW OF SYSTEMS:    10 Point review of Systems was done is negative except as noted above.  PHYSICAL EXAMINATION: ECOG PERFORMANCE STATUS: 2 - Symptomatic, <50% confined to bed  . Vitals:   10/22/15 1047  BP: 130/68  Pulse: 72  Resp: 20  Temp: 98.7 F (37.1 C)   Filed Weights   10/22/15 1047  Weight:  268 lb (121.6 kg)   .Body mass index is 35.36 kg/m.  GENERAL:alert, in no acute distress and comfortable SKIN: skin color, texture, turgor are normal, no rashes or significant lesions EYES: normal, conjunctiva are pink and non-injected, sclera clear OROPHARYNX:no exudate, no erythema and lips, buccal mucosa, and tongue normal  NECK: supple, no JVD, thyroid normal size, non-tender, without nodularity LYMPH:  no palpable lymphadenopathy in the cervical, axillary or inguinal LUNGS: clear to auscultation with normal respiratory effort HEART: regular rate & rhythm,  no murmurs and no lower extremity edema ABDOMEN: abdomen soft, non-tender, normoactive bowel sounds  Musculoskeletal: left BKA with prosthesis. PSYCH: alert & oriented x  3 with fluent speech NEURO: no focal motor/sensory deficits  LABORATORY DATA:  I have reviewed the data as listed  . CBC Latest Ref Rng & Units 08/12/2011 04/02/2011 07/01/2010  WBC 4.0 - 10.5 K/uL 5.9 7.7 5.2  Hemoglobin 13.0 - 17.0 g/dL 12.0(L) 11.5(L) 12.0(L)  Hematocrit 39.0 - 52.0 % 36.2(L) 34.7(L) 34.4(L)  Platelets 150 - 400 K/uL 141(L) 224 137(L)    . CMP Latest Ref Rng & Units 08/16/2011 08/15/2011 08/14/2011  Glucose 70 - 99 mg/dL 115(H) 85 57(L)  BUN 6 - 23 mg/dL 38(H) 36(H) 34(H)  Creatinine 0.50 - 1.35 mg/dL 1.69(H) 1.79(H) 1.85(H)  Sodium 135 - 145 mEq/L 137 137 139  Potassium 3.5 - 5.1 mEq/L 4.4 4.1 4.5  Chloride 96 - 112 mEq/L 100 102 105  CO2 19 - 32 mEq/L 27 25 26   Calcium 8.4 - 10.5 mg/dL 9.0 9.1 8.8  Total Protein 6.0 - 8.3 g/dL - - -  Total Bilirubin 0.3 - 1.2 mg/dL - - -  Alkaline Phos 39 - 117 U/L - - -  AST 0 - 37 U/L - - -  ALT 0 - 53 U/L - - -     RADIOGRAPHIC STUDIES: I have personally reviewed the radiological images as listed and agreed with the findings in the report. No results found.  ASSESSMENT & PLAN:   82 yo caucasian male with   #1 Anemia of chronic disease due to CKD stage 4. Patient has been on Epogen for the  last several months with his hemoglobin in the high 9 and low 10 range. He has not had any issues with uncontrolled hypertension or previous venous thrombosis .  #2 History of prostate cancer status post hysterectomy and radiation therapy on Lupron every 3 months. As per patient his PSA level has been completely suppressed. He follows with urology Dr. Exie Parody.   #3 chronic kidney disease stage IV- patient is followed by Dr Lowanda Foster  Plan -Given that the patient has no evidence of active malignancy at this time and no previous history of venous thrombosis there is no overt contraindication for judicious use of Epogen to try to maintain hgb in the 9.5-10.5 range. -IV iron when necessary to maintain ferritin level >100. -patient is on Coumadin for his atrial fibrillation which will probably provide additional safety from a risk of venous thromboembolism standpoint .  -If somebody had active malignancy that was being treated with curative intent would hold off on Neupogen due to possible growth factor like effects. Unlikely that this would be a concern with the patient's history of prostate cancer currently in remission. Would be okay to continue from a risk vs benefit perspective. If patient had relapse of his prostate cancer might need to re-consider.  #3 history of recurrent urinary tract infections  -Patient is on prophylactic Macrobid as per urology. -With his kidney function would strongly recommend alternatives since the Macrobid can cause significant peripheral neuropathy and hepatotoxicity and other potential side effects in the setting of chronic kidney disease with GFR less than 60.  #4 . Patient Active Problem List   Diagnosis Date Noted  . Prostate cancer ([place]) 09/11/2014  . CKD (chronic kidney disease) stage 4, GFR 15-29 ml/min (HCC) 12/18/2013  . Cardiomyopathy, ischemic 12/18/2013  . Encounter for therapeutic drug monitoring 12/18/2013  . Warfarin anticoagulation 12/18/2013  .  Sinus bradycardia 10/19/2012  . Carotid artery disease ([place])   . Upper GI bleeding   . Iron deficiency anemia   . Hypertension   .  Atrial fibrillation ([place])   . Peripheral vascular disease ([place])   . Hyperlipidemia   . S/P AKA (above knee amputation) unilateral ([place])   . Hx of CABG   . CAD (coronary artery disease)   . Ejection fraction   . Internal mammary artery injury   . Subclavian artery stenosis, left ([place]) 03/26/2011  . SHORTNESS OF BREATH 05/12/2009  . AODM 12/05/2007  Plan -mx per primary care physician.  RTC with Dr Whitney Muse on an as needed basis if any new questions or concerns arise.  All of the patients questions were answered with apparent satisfaction. The patient knows to call the clinic with any problems, questions or concerns.  I spent 45 minutes counseling the patient face to face. The total time spent in the appointment was 60 minutes and more than 50% was on counseling and direct patient cares.    Sullivan Lone MD [place] AAHIVMS [facility] [place] Hematology/Oncology Physician [facility]  (Office):       [phone] (Work cell):  [phone] (Fax):           [phone]  10/22/2015 10:58 AM

## 2015-11-04 ENCOUNTER — Ambulatory Visit (INDEPENDENT_AMBULATORY_CARE_PROVIDER_SITE_OTHER): Payer: Medicare Other | Admitting: *Deleted

## 2015-11-04 DIAGNOSIS — I4891 Unspecified atrial fibrillation: Secondary | ICD-10-CM

## 2015-11-04 DIAGNOSIS — Z5181 Encounter for therapeutic drug level monitoring: Secondary | ICD-10-CM

## 2015-11-04 LAB — POCT INR: INR: 2

## 2015-11-10 ENCOUNTER — Other Ambulatory Visit: Payer: Self-pay | Admitting: Cardiology

## 2015-11-10 MED ORDER — PRAVASTATIN SODIUM 80 MG PO TABS: 80.0000 mg | ORAL_TABLET | Freq: Every day | ORAL | 0 refills | Status: DC

## 2015-11-10 NOTE — Telephone Encounter (Signed)
Refill:   pravastatin (PRAVACHOL) 80 MG tablet  [place], [place], [place]

## 2015-11-10 NOTE — Telephone Encounter (Signed)
Done

## 2015-12-02 ENCOUNTER — Ambulatory Visit (INDEPENDENT_AMBULATORY_CARE_PROVIDER_SITE_OTHER): Payer: Medicare Other | Admitting: *Deleted

## 2015-12-02 DIAGNOSIS — I255 Ischemic cardiomyopathy: Secondary | ICD-10-CM

## 2015-12-02 DIAGNOSIS — I4891 Unspecified atrial fibrillation: Secondary | ICD-10-CM

## 2015-12-02 DIAGNOSIS — Z5181 Encounter for therapeutic drug level monitoring: Secondary | ICD-10-CM

## 2015-12-02 LAB — POCT INR: INR: 2.6

## 2015-12-19 ENCOUNTER — Other Ambulatory Visit: Payer: Self-pay | Admitting: Cardiology

## 2015-12-19 MED ORDER — WARFARIN SODIUM 5 MG PO TABS: 5.0000 mg | ORAL_TABLET | Freq: Every day | ORAL | 2 refills | Status: DC

## 2015-12-19 NOTE — Telephone Encounter (Signed)
Medication sent to pharmacy  

## 2015-12-19 NOTE — Telephone Encounter (Signed)
Refill: warfarin (COUMADIN) 5 MG tablet  [place], [place]

## 2015-12-30 ENCOUNTER — Ambulatory Visit (INDEPENDENT_AMBULATORY_CARE_PROVIDER_SITE_OTHER): Payer: Medicare Other | Admitting: *Deleted

## 2015-12-30 DIAGNOSIS — Z5181 Encounter for therapeutic drug level monitoring: Secondary | ICD-10-CM | POA: Diagnosis not present

## 2015-12-30 DIAGNOSIS — I4891 Unspecified atrial fibrillation: Secondary | ICD-10-CM | POA: Diagnosis not present

## 2015-12-30 DIAGNOSIS — I255 Ischemic cardiomyopathy: Secondary | ICD-10-CM

## 2015-12-30 LAB — POCT INR: INR: 3.4

## 2016-01-20 ENCOUNTER — Ambulatory Visit (INDEPENDENT_AMBULATORY_CARE_PROVIDER_SITE_OTHER): Payer: Medicare Other | Admitting: *Deleted

## 2016-01-20 DIAGNOSIS — Z5181 Encounter for therapeutic drug level monitoring: Secondary | ICD-10-CM

## 2016-01-20 DIAGNOSIS — I4891 Unspecified atrial fibrillation: Secondary | ICD-10-CM

## 2016-01-20 LAB — POCT INR: INR: 2.9

## 2016-01-26 ENCOUNTER — Other Ambulatory Visit: Payer: Self-pay | Admitting: Cardiology

## 2016-01-26 MED ORDER — DOXAZOSIN MESYLATE 4 MG PO TABS: 4.0000 mg | ORAL_TABLET | Freq: Two times a day (BID) | ORAL | 3 refills | Status: AC

## 2016-01-26 NOTE — Telephone Encounter (Signed)
Refill: doxazosin (CARDURA) 4 MG   Please send to [place] ,[place], [place]

## 2016-01-26 NOTE — Telephone Encounter (Signed)
Medication sent to pharmacy  

## 2016-02-09 ENCOUNTER — Other Ambulatory Visit: Payer: Self-pay | Admitting: Cardiology

## 2016-02-09 MED ORDER — PRAVASTATIN SODIUM 80 MG PO TABS: 80.0000 mg | ORAL_TABLET | Freq: Every day | ORAL | 1 refills | Status: AC

## 2016-02-09 NOTE — Telephone Encounter (Signed)
Medication sent to pharmacy  

## 2016-02-09 NOTE — Telephone Encounter (Signed)
pravastatin (PRAVACHOL) 80 MG tablet   [facility], [place]

## 2016-02-16 ENCOUNTER — Other Ambulatory Visit: Payer: Self-pay

## 2016-02-16 MED ORDER — ISOSORBIDE MONONITRATE ER 60 MG PO TB24: 90.0000 mg | ORAL_TABLET | Freq: Every day | ORAL | 6 refills | Status: AC

## 2016-02-16 NOTE — Telephone Encounter (Signed)
Done

## 2016-02-16 NOTE — Telephone Encounter (Signed)
Refill:   isosorbide mononitrate (IMDUR) 60 MG [place]   [place], [place]

## 2016-02-17 ENCOUNTER — Ambulatory Visit (INDEPENDENT_AMBULATORY_CARE_PROVIDER_SITE_OTHER): Payer: Medicare Other | Admitting: *Deleted

## 2016-02-17 DIAGNOSIS — I4891 Unspecified atrial fibrillation: Secondary | ICD-10-CM | POA: Diagnosis not present

## 2016-02-17 DIAGNOSIS — Z5181 Encounter for therapeutic drug level monitoring: Secondary | ICD-10-CM

## 2016-02-17 LAB — POCT INR: INR: 3.1

## 2016-02-20 ENCOUNTER — Telehealth: Payer: Self-pay | Admitting: Cardiology

## 2016-02-20 MED ORDER — CARVEDILOL 6.25 MG PO TABS
6.2500 mg | ORAL_TABLET | Freq: Two times a day (BID) | ORAL | 3 refills | Status: AC
Start: 2016-02-20 — End: ?

## 2016-02-20 NOTE — Telephone Encounter (Signed)
Done

## 2016-02-20 NOTE — Telephone Encounter (Signed)
carvedilol (COREG) 6.25 MG tablet   Send refills to [facility] in [place], [place]

## 2016-02-23 NOTE — Progress Notes (Signed)
Cardiology Office Note  Date: 02/24/2016   ID: [name], DOB [birth year], MRN [id number]  PCP: Deloria Lair, MD  Primary Cardiologist: Rozann Lesches, MD   Chief Complaint  Patient presents with  . Coronary Artery Disease    History of Present Illness: Benjamin [name] is a medically complex 83 y.o. male that I last saw in August 2017. He is here today with his wife for a follow-up visit. Fortunately, continues to do reasonably well. His weight has been stable compared to last visit, he remains on a stable dose of Demadex. No interval hospitalizations.  He is not reporting any angina symptoms or nitroglycerin use. No palpitations. Current cardiac regimen includes Coumadin, Demadex, Pravachol, as needed nitroglycerin, Imdur, hydralazine, Cardura, and Coreg.  He still follows regularly with Dr. Scotty Court and Dr. Lowanda Foster. Last creatinine was 3.5.  Past Medical History:  Diagnosis Date  . CAD (coronary artery disease)    Multivessel status post CABG 2004, DES to OM 2006, subsequently documented graft disease  . Carotid artery disease ([place])   . CKD (chronic kidney disease) stage 4, GFR 15-29 ml/min (HCC)    Follows with Dr. Lowanda Foster  . DJD (degenerative joint disease)   . Essential hypertension   . GERD (gastroesophageal reflux disease)   . History of GI bleed   . History of pneumonia   . Hyperlipidemia   . Iron deficiency anemia   . Ischemic cardiomyopathy   . Obesity   . Paroxysmal atrial fibrillation (HCC)   . Peripheral vascular disease ([place])   . Type 2 diabetes mellitus ([place])     Past Surgical History:  Procedure Laterality Date  . AMPUTATION  08/12/2011   Procedure: AMPUTATION BELOW KNEE;  Surgeon: Newt Minion, MD;  Location: [place];  Service: Orthopedics;  Laterality: Left;  Left Below Knee Amputation  . ANTERIOR CERVICAL DISCECTOMY    . CORONARY ARTERY BYPASS GRAFT  2004   LIMA to LAD, SVG to diagonal, SVG to OM  . I&D EXTREMITY  04/07/2011   Procedure:  IRRIGATION AND DEBRIDEMENT EXTREMITY;  Surgeon: Newt Minion, MD;  Location: [facility];  Service: Orthopedics;  Laterality: Left;  Left Partial Calcaneal Excision, Place Antibiotic Beads  . LAMINECTOMY      Current Outpatient Prescriptions  Medication Sig Dispense Refill  . acetaminophen (TYLENOL) 325 MG tablet Take 650 mg by mouth as needed.    . carvedilol (COREG) 6.25 MG tablet Take 1 tablet (6.25 mg total) by mouth 2 (two) times daily. 180 tablet 3  . doxazosin (CARDURA) 4 MG tablet Take 1 tablet (4 mg total) by mouth 2 (two) times daily. 60 tablet 3  . ferrous sulfate 325 (65 FE) MG tablet Take 325 mg by mouth daily.    Marland Kitchen gabapentin (NEURONTIN) 300 MG capsule Take 300 mg by mouth 3 (three) times daily.    Marland Kitchen glipiZIDE (GLUCOTROL XL) 10 MG 24 hr tablet Take 10 mg by mouth daily.    . hydrALAZINE (APRESOLINE) 25 MG tablet Take 25 mg by mouth 3 (three) times daily.    . isosorbide mononitrate (IMDUR) 60 MG 24 hr tablet Take 1.5 tablets (90 mg total) by mouth daily. 45 tablet 6  . L-Methylfolate-B6-B12 (METANX) 3-35-2 MG TABS Take 1 tablet by mouth daily.      . nitrofurantoin, macrocrystal-monohydrate, (MACROBID) 100 MG capsule Take 100 mg by mouth at bedtime.     . nitroGLYCERIN (NITROSTAT) 0.4 MG SL tablet Place 0.4 mg under the tongue every 5 (  five) minutes as needed. For chest pain    . pantoprazole (PROTONIX) 40 MG tablet Take 40 mg by mouth at bedtime.     . pravastatin (PRAVACHOL) 80 MG tablet Take 1 tablet (80 mg total) by mouth daily. 90 tablet 1  . tamsulosin (FLOMAX) 0.4 MG CAPS capsule Take 0.4 mg by mouth daily.    Marland Kitchen torsemide (DEMADEX) 20 MG tablet Take 40 mg by mouth daily.    Marland Kitchen warfarin (COUMADIN) 5 MG tablet Take 1-1.5 tablets (5-7.5 mg total) by mouth daily. As directed by Coumadin clinic 45 tablet 2   No current facility-administered medications for this visit.    Allergies:  Penicillins and Percocet [oxycodone-acetaminophen]   Social History: The patient  reports that he  has never smoked. He has never used smokeless tobacco. He reports that he does not drink alcohol or use drugs.   ROS:  Please see the history of present illness. Otherwise, complete review of systems is positive for limited mobility, uses a walker.  All other systems are reviewed and negative.   Physical Exam: VS:  BP (!) 157/87   Pulse 89   Ht 6' 1"  (1.854 m)   Wt 275 lb (124.7 kg)   SpO2 98%   BMI 36.28 kg/m , BMI Body mass index is 36.28 kg/m.  Wt Readings from Last 3 Encounters:  02/24/16 275 lb (124.7 kg)  10/22/15 268 lb (121.6 kg)  08/28/15 274 lb (124.3 kg)    General: Obese male, appears comfortable at rest. Using a walker. HEENT: Conjunctiva and lids normal, oropharynx clear. Neck: Supple, no elevated JVP or carotid bruits, no thyromegaly. Lungs: Clear to auscultation, nonlabored breathing at rest. Cardiac: Irregularly irregular, no S3, soft systolic murmur, no pericardial rub. Abdomen: Protuberant, nontender, bowel sounds present, no guarding or rebound. Extremities: Status post left below the knee amputation, right distal pulses 1-2+. Skin: Warm and dry. Musculoskeletal: No kyphosis. Neuropsychiatric: Alert and oriented x3, affect grossly appropriate.  ECG: I personally reviewed the tracing from 07/02/2015 which showed sinus rhythm with IVCD and repolarization abnormalities.  Recent Labwork:  August 2017: BUN 73, creatinine 3.5, potassium 4.8, hemoglobin 9.8  Other Studies Reviewed Today:  Echocardiogram 12/10/2013 [facility]): Moderate LVH with LVEF 30-35%, moderate to severe left atrial enlargement, mildly dilated RV with normal contraction, moderately sclerotic aortic valve without stenosis, MAC with mild mitral regurgitation, no significant tricuspid regurgitation.  Assessment and Plan:  1. Persistent atrial fibrillation. Plan is to continue strategy of heart rate control with Coreg and anticoagulation on Coumadin.  2. Ischemic cardiomyopathy, LVEF 30-35%.  Stable in terms of chronic systolic heart failure symptoms on present dose of Demadex. Managed conservatively over time, and a plan for ICD.  3. CKD, stage 4. Last creatinine 3.5.  4. Symptomatically stable CAD status post CABG and DES to the native OM. He has known graft disease and continues on medical therapy without progressive angina.  Current medicines were reviewed with the patient today.  Disposition: Follow-up in 6 months.  Signed, Satira Sark, MD, [facility] 02/24/2016 3:09 PM    [facility] [place], [place], [place] [address] Phone: [phone]; Fax: [phone]

## 2016-02-24 ENCOUNTER — Encounter: Payer: Self-pay | Admitting: Cardiology

## 2016-02-24 ENCOUNTER — Ambulatory Visit (INDEPENDENT_AMBULATORY_CARE_PROVIDER_SITE_OTHER): Payer: Medicare Other | Admitting: Cardiology

## 2016-02-24 VITALS — BP 157/87 | HR 89 | Ht 73.0 in | Wt 275.0 lb

## 2016-02-24 DIAGNOSIS — I25810 Atherosclerosis of coronary artery bypass graft(s) without angina pectoris: Secondary | ICD-10-CM

## 2016-02-24 DIAGNOSIS — I481 Persistent atrial fibrillation: Secondary | ICD-10-CM

## 2016-02-24 DIAGNOSIS — N184 Chronic kidney disease, stage 4 (severe): Secondary | ICD-10-CM

## 2016-02-24 DIAGNOSIS — I255 Ischemic cardiomyopathy: Secondary | ICD-10-CM

## 2016-02-24 DIAGNOSIS — I4819 Other persistent atrial fibrillation: Secondary | ICD-10-CM

## 2016-02-24 NOTE — Patient Instructions (Signed)
Your physician wants you to follow-up in: 6 months with Dr. McDowell You will receive a reminder letter in the mail two months in advance. If you don't receive a letter, please call our office to schedule the follow-up appointment.  Your physician recommends that you continue on your current medications as directed. Please refer to the Current Medication list given to you today.  Thank you for choosing  [facility]!!    

## 2016-03-25 ENCOUNTER — Ambulatory Visit (INDEPENDENT_AMBULATORY_CARE_PROVIDER_SITE_OTHER): Payer: Medicare Other | Admitting: *Deleted

## 2016-03-25 DIAGNOSIS — Z5181 Encounter for therapeutic drug level monitoring: Secondary | ICD-10-CM

## 2016-03-25 DIAGNOSIS — I4891 Unspecified atrial fibrillation: Secondary | ICD-10-CM | POA: Diagnosis not present

## 2016-03-25 DIAGNOSIS — I255 Ischemic cardiomyopathy: Secondary | ICD-10-CM | POA: Diagnosis not present

## 2016-03-25 LAB — POCT INR: INR: 3.7

## 2016-04-06 ENCOUNTER — Ambulatory Visit: Payer: Medicare Other | Admitting: Urology

## 2016-04-08 ENCOUNTER — Ambulatory Visit (INDEPENDENT_AMBULATORY_CARE_PROVIDER_SITE_OTHER): Payer: Medicare Other | Admitting: *Deleted

## 2016-04-08 DIAGNOSIS — Z5181 Encounter for therapeutic drug level monitoring: Secondary | ICD-10-CM | POA: Diagnosis not present

## 2016-04-08 DIAGNOSIS — I4891 Unspecified atrial fibrillation: Secondary | ICD-10-CM | POA: Diagnosis not present

## 2016-04-08 DIAGNOSIS — I255 Ischemic cardiomyopathy: Secondary | ICD-10-CM

## 2016-04-08 LAB — POCT INR: INR: 2.5

## 2016-04-19 ENCOUNTER — Other Ambulatory Visit: Payer: Self-pay | Admitting: Cardiology

## 2016-04-19 MED ORDER — WARFARIN SODIUM 5 MG PO TABS: 5.0000 mg | ORAL_TABLET | Freq: Every day | ORAL | 2 refills | Status: AC

## 2016-04-19 NOTE — Telephone Encounter (Signed)
Requested Prescriptions    No prescriptions requested or ordered in this encounter  warfarin (COUMADIN) 5 MG tablet   [facility]

## 2016-04-19 NOTE — Telephone Encounter (Signed)
Medication sent to pharmacy  

## 2016-04-27 ENCOUNTER — Ambulatory Visit: Payer: Medicare Other | Admitting: Urology

## 2016-06-10 ENCOUNTER — Ambulatory Visit: Payer: Medicare Other | Admitting: Cardiology

## 2016-06-10 ENCOUNTER — Telehealth: Payer: Self-pay | Admitting: Cardiology

## 2016-06-10 NOTE — Telephone Encounter (Signed)
Wife called asking that an order be sent for [facility] to check his INR until he is able to return to [facility]

## 2016-06-14 NOTE — Telephone Encounter (Signed)
Order faxed to [facility].

## 2016-06-18 ENCOUNTER — Telehealth: Payer: Self-pay | Admitting: Cardiology

## 2016-06-18 NOTE — Telephone Encounter (Signed)
Returned a call to [place] at [facility] she stated she has not received the orders that were faxed by Lattie Haw per chart, therefore, sent orders to have INR checked on Monday 06/21/16 & fax to or call to [facility]. She stated that the pt has restarted Vancomycin but no other new meds & was just d/c from hospital on 06/17/16. Orders faxed to [facility] attention per [facility] &  confirmation was received.

## 2016-06-18 NOTE — Telephone Encounter (Signed)
Samantha with [facility] called stating that they need an order from Dr. Domenic Polite to be able to go  To patient's home to draw his PT/INR     [phone]. Fax #  [phone].

## 2016-06-18 NOTE — Telephone Encounter (Signed)
Cristopher [place] faxed orders on 5/31 - Samantha from [facility] needing new orders for home health on how pt is to continue coumadin

## 2016-06-21 ENCOUNTER — Telehealth: Payer: Self-pay | Admitting: Cardiology

## 2016-06-21 NOTE — Telephone Encounter (Signed)
Nurse asked that you call the patient that she was leaving the home now. She was unable to draw PT/INR due to recent health issues

## 2016-06-22 NOTE — Telephone Encounter (Signed)
Home Health nurse will try to draw blood on 6/14 for PT/INR since they don't have a CoAg machine.  If unable to obtain blood sample wife will try to get pt to the office for fingerstick INR check.

## 2016-06-24 ENCOUNTER — Ambulatory Visit (INDEPENDENT_AMBULATORY_CARE_PROVIDER_SITE_OTHER): Payer: Medicare Other | Admitting: Cardiology

## 2016-06-24 DIAGNOSIS — Z5181 Encounter for therapeutic drug level monitoring: Secondary | ICD-10-CM

## 2016-06-24 LAB — PROTIME-INR: INR: 2.5 — AB (ref <=1.1)

## 2016-07-12 ENCOUNTER — Ambulatory Visit (INDEPENDENT_AMBULATORY_CARE_PROVIDER_SITE_OTHER): Payer: Medicare Other | Admitting: Orthopedic Surgery

## 2016-07-12 ENCOUNTER — Encounter (INDEPENDENT_AMBULATORY_CARE_PROVIDER_SITE_OTHER): Payer: Self-pay | Admitting: Orthopedic Surgery

## 2016-07-12 VITALS — Ht 73.0 in | Wt 275.0 lb

## 2016-07-12 DIAGNOSIS — I255 Ischemic cardiomyopathy: Secondary | ICD-10-CM

## 2016-07-12 DIAGNOSIS — T8781 Dehiscence of amputation stump: Secondary | ICD-10-CM

## 2016-07-12 NOTE — Progress Notes (Signed)
Office Visit Note   Patient: Benjamin [name]           Date of Birth: April 06, 1933           MRN: [id number] Visit Date: 07/12/2016              Requested by: Deloria Lair., MD [place], [place] [address] PCP: Deloria Lair., MD  Chief Complaint  Patient presents with  . Left Leg - Follow-up    08/12/11 left below the knee amputation       HPI: Patient presents in follow-up for his left transtibial amputation. He is to ischemic ulcers over the tip of the residual limb which had been present since April. Patient is not wearing his prosthesis since then. He is currently wearing his shrinker.  Patient has been hospitalized 4 times over the last several months with a diagnosis including urinary tract infection as well as C. difficile. By report patient has been not eating well.  Assessment & Plan: Visit Diagnoses:  1. Dehiscence of amputation stump (HCC)     Plan: Recommended P based protein supplements. 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 He is on Coumadin and the protein supplement should not affect his INR. Continue with the stump shrinker. Reevaluated follow-up. They will follow up sooner if the ulcer breaks down.  Follow-Up Instructions: Return in about 4 weeks (around 08/09/2016).   Ortho Exam  Patient is alert, oriented, no adenopathy, well-dressed, normal affect, normal respiratory effort. Examination patient ambulates in a wheelchair. Patient's residual limb is cool to the touch there is no cellulitis no drainage. Patient does have 2 very thin black eschar ischemic areas which are about 10 mm in diameter and 0.1 mm deep. There is no tenderness to palpation no fluctuance.  Imaging: No results found.  Labs: No results found for: HGBA1C, ESRSEDRATE, CRP, LABURIC, REPTSTATUS, GRAMSTAIN, CULT, LABORGA  Orders:  No orders of the defined types were placed in this encounter.  No orders of the defined types were placed in this encounter.     Procedures: No procedures performed  Clinical Data: No additional findings.  ROS:  All other systems negative, except as noted in the HPI. Review of Systems  Objective: Vital Signs: Ht 6\' 1"  (1.854 m)   Wt 275 lb (124.7 kg)   BMI 36.28 kg/m   Specialty Comments:  No specialty comments available.  PMFS History: Patient Active Problem List   Diagnosis Date Noted  . Prostate cancer ([place]) 09/11/2014  . CKD (chronic kidney disease) stage 4, GFR 15-29 ml/min (HCC) 12/18/2013  . Cardiomyopathy, ischemic 12/18/2013  . Encounter for therapeutic drug monitoring 12/18/2013  . Warfarin anticoagulation 12/18/2013  . Sinus bradycardia 10/19/2012  . Carotid artery disease ([place])   . Upper GI bleeding   . Iron deficiency anemia   . Hypertension   . Atrial fibrillation ([place])   . Peripheral vascular disease ([place])   . Hyperlipidemia   . S/P AKA (above knee amputation) unilateral ([place])   . Hx of CABG   . CAD (coronary artery disease)   . Ejection fraction   . Internal mammary artery injury   . Subclavian artery stenosis, left ([place]) 03/26/2011  . SHORTNESS OF BREATH 05/12/2009  . AODM 12/05/2007   Past Medical History:  Diagnosis Date  . CAD (coronary artery disease)    Multivessel status post CABG 2004, DES to OM 2006, subsequently documented graft disease  . Carotid artery disease ([place])   . CKD (  chronic kidney disease) stage 4, GFR 15-29 ml/min (HCC)    Follows with Dr. Lowanda Foster  . DJD (degenerative joint disease)   . Essential hypertension   . GERD (gastroesophageal reflux disease)   . History of GI bleed   . History of pneumonia   . Hyperlipidemia   . Iron deficiency anemia   . Ischemic cardiomyopathy   . Obesity   . Paroxysmal atrial fibrillation (HCC)   . Peripheral vascular disease ([place])   . Type 2 diabetes mellitus (HCC)     Family History  Problem Relation Age of Onset  . Heart disease Father   . Coronary artery disease Mother   . Coronary artery disease  Brother     Past Surgical History:  Procedure Laterality Date  . AMPUTATION  08/12/2011   Procedure: AMPUTATION BELOW KNEE;  Surgeon: Newt Minion, MD;  Location: [place];  Service: Orthopedics;  Laterality: Left;  Left Below Knee Amputation  . ANTERIOR CERVICAL DISCECTOMY    . CORONARY ARTERY BYPASS GRAFT  2004   LIMA to LAD, SVG to diagonal, SVG to OM  . I&D EXTREMITY  04/07/2011   Procedure: IRRIGATION AND DEBRIDEMENT EXTREMITY;  Surgeon: Newt Minion, MD;  Location: [place] [facility];  Service: Orthopedics;  Laterality: Left;  Left Partial Calcaneal Excision, Place Antibiotic Beads  . LAMINECTOMY     Social History   Occupational History  . Not on file.   Social History Main Topics  . Smoking status: Never Smoker  . Smokeless tobacco: Never Used  . Alcohol use No  . Drug use: No  . Sexual activity: Not Currently

## 2016-07-26 ENCOUNTER — Telehealth (INDEPENDENT_AMBULATORY_CARE_PROVIDER_SITE_OTHER): Payer: Self-pay | Admitting: Orthopedic Surgery

## 2016-07-26 NOTE — Telephone Encounter (Signed)
Patient's wife Benjamin [name]) called to let Dr. Sharol Given know patient is deceased.   [phone]  Phone number for [facility]

## 2016-07-28 NOTE — Telephone Encounter (Signed)
We will send condolence card to patients family.

## 2016-08-09 ENCOUNTER — Ambulatory Visit (INDEPENDENT_AMBULATORY_CARE_PROVIDER_SITE_OTHER): Payer: Medicare Other | Admitting: Orthopedic Surgery

## 2016-08-11 DEATH — deceased
